# Patient Record
Sex: Male | Born: 1945 | Race: White | Hispanic: No | Marital: Married | Smoking: Former smoker
Health system: Southern US, Community
[De-identification: ages and names within clinical notes are randomized; demographics above are authoritative.]

## PROBLEM LIST (undated history)

## (undated) DIAGNOSIS — Z860100 Personal history of colon polyps, unspecified: Secondary | ICD-10-CM

## (undated) DIAGNOSIS — C439 Malignant melanoma of skin, unspecified: Secondary | ICD-10-CM

## (undated) DIAGNOSIS — L570 Actinic keratosis: Secondary | ICD-10-CM

## (undated) DIAGNOSIS — E785 Hyperlipidemia, unspecified: Secondary | ICD-10-CM

## (undated) DIAGNOSIS — T7840XA Allergy, unspecified, initial encounter: Secondary | ICD-10-CM

## (undated) DIAGNOSIS — M1990 Unspecified osteoarthritis, unspecified site: Secondary | ICD-10-CM

## (undated) DIAGNOSIS — J302 Other seasonal allergic rhinitis: Secondary | ICD-10-CM

## (undated) DIAGNOSIS — I10 Essential (primary) hypertension: Secondary | ICD-10-CM

## (undated) DIAGNOSIS — I499 Cardiac arrhythmia, unspecified: Secondary | ICD-10-CM

## (undated) HISTORY — PX: COLONOSCOPY: SHX174

## (undated) HISTORY — DX: Unspecified osteoarthritis, unspecified site: M19.90

## (undated) HISTORY — DX: Allergy, unspecified, initial encounter: T78.40XA

## (undated) HISTORY — DX: Personal history of colon polyps, unspecified: Z86.0100

## (undated) HISTORY — PX: TOTAL KNEE ARTHROPLASTY: SHX125

## (undated) HISTORY — DX: Malignant melanoma of skin, unspecified: C43.9

## (undated) HISTORY — DX: Personal history of colonic polyps: Z86.010

## (undated) HISTORY — DX: Actinic keratosis: L57.0

## (undated) HISTORY — DX: Hyperlipidemia, unspecified: E78.5

## (undated) HISTORY — DX: Other seasonal allergic rhinitis: J30.2

## (undated) HISTORY — PX: POLYPECTOMY: SHX149

## (undated) HISTORY — DX: Cardiac arrhythmia, unspecified: I49.9

## (undated) HISTORY — DX: Essential (primary) hypertension: I10

---

## 2005-02-03 ENCOUNTER — Ambulatory Visit: Payer: Self-pay | Admitting: Internal Medicine

## 2005-07-12 ENCOUNTER — Ambulatory Visit: Payer: Self-pay | Admitting: Internal Medicine

## 2005-07-24 ENCOUNTER — Ambulatory Visit: Payer: Self-pay | Admitting: Internal Medicine

## 2006-01-05 ENCOUNTER — Ambulatory Visit: Payer: Self-pay | Admitting: Dermatology

## 2006-05-24 ENCOUNTER — Ambulatory Visit: Payer: Self-pay | Admitting: Dermatology

## 2007-10-25 DIAGNOSIS — C439 Malignant melanoma of skin, unspecified: Secondary | ICD-10-CM

## 2007-10-25 HISTORY — DX: Malignant melanoma of skin, unspecified: C43.9

## 2007-10-25 HISTORY — PX: MELANOMA EXCISION: SHX5266

## 2008-07-18 ENCOUNTER — Ambulatory Visit: Payer: Self-pay | Admitting: Internal Medicine

## 2008-08-01 ENCOUNTER — Ambulatory Visit: Payer: Self-pay | Admitting: Internal Medicine

## 2009-10-24 HISTORY — PX: TOTAL KNEE ARTHROPLASTY: SHX125

## 2013-05-23 ENCOUNTER — Encounter: Payer: Self-pay | Admitting: Internal Medicine

## 2013-06-12 ENCOUNTER — Encounter: Payer: Self-pay | Admitting: Internal Medicine

## 2013-06-19 ENCOUNTER — Ambulatory Visit (AMBULATORY_SURGERY_CENTER): Payer: BC Managed Care – PPO | Admitting: *Deleted

## 2013-06-19 VITALS — Ht 72.0 in | Wt 197.0 lb

## 2013-06-19 DIAGNOSIS — Z1211 Encounter for screening for malignant neoplasm of colon: Secondary | ICD-10-CM

## 2013-06-19 MED ORDER — MOVIPREP 100 G PO SOLR: 1.0000 | Freq: Once | ORAL | Status: DC

## 2013-06-19 NOTE — Progress Notes (Signed)
Denies allergies to eggs or soy products. Denies complications with sedation or anesthesia. 

## 2013-06-20 ENCOUNTER — Encounter: Payer: Self-pay | Admitting: Internal Medicine

## 2013-07-02 ENCOUNTER — Encounter: Payer: Self-pay | Admitting: Internal Medicine

## 2013-07-02 ENCOUNTER — Ambulatory Visit (AMBULATORY_SURGERY_CENTER): Payer: Medicare Other | Admitting: Internal Medicine

## 2013-07-02 VITALS — BP 110/67 | HR 64 | Temp 98.0°F | Resp 14 | Ht 72.0 in | Wt 197.0 lb

## 2013-07-02 DIAGNOSIS — Z800 Family history of malignant neoplasm of digestive organs: Secondary | ICD-10-CM

## 2013-07-02 DIAGNOSIS — Z86010 Personal history of colonic polyps: Secondary | ICD-10-CM

## 2013-07-02 DIAGNOSIS — Z1211 Encounter for screening for malignant neoplasm of colon: Secondary | ICD-10-CM

## 2013-07-02 DIAGNOSIS — D126 Benign neoplasm of colon, unspecified: Secondary | ICD-10-CM

## 2013-07-02 MED ORDER — SODIUM CHLORIDE 0.9 % IV SOLN: 500.0000 mL | INTRAVENOUS | Status: DC

## 2013-07-02 NOTE — Op Note (Signed)
[facility] [address] [place], [address]   COLONOSCOPY PROCEDURE REPORT  PATIENT: [name], [name]  MR#: [id number] BIRTHDATE: 1946-05-29 , 66  yrs. old GENDER: Male ENDOSCOPIST: Hart Carwin, MD REFERRED NF:AOZHY Masoud, M.D. PROCEDURE DATE:  07/02/2013 PROCEDURE:   Colonoscopy with snare polypectomy First Screening Colonoscopy - Avg.  risk and is 50 yrs.  old or older - No.  Prior Negative Screening - Now for repeat screening. N/A  History of Adenoma - Now for follow-up colonoscopy & has been > or = to 3 yrs.  Yes hx of adenoma.  Has been 3 or more years since last colonoscopy.  Polyps Removed Today? Yes. ASA CLASS:   Class II INDICATIONS:colonoscopy 2004- hyperplastic polyp, 2009- no polyps, LLQ abdominal pain. MEDICATIONS: propofol (Diprivan) 300mg  IV  DESCRIPTION OF PROCEDURE:   After the risks benefits and alternatives of the procedure were thoroughly explained, informed consent was obtained.  A digital rectal exam revealed no abnormalities of the rectum.   The LB [id number]  endoscope was introduced through the anus and advanced to the cecum, which was identified by both the appendix and ileocecal valve. No adverse events experienced.   The quality of the prep was good, using MoviPrep  The instrument was then slowly withdrawn as the colon was fully examined.      COLON FINDINGS: A polypoid shaped pedunculated polyp ranging between 3-16mm in size was found at the ileocecal valve.  A polypectomy was performed with a cold snare.  The resection was complete and the polyp tissue was completely retrieved.there was small amount of persistent bleeing from polypectomy site- Endo clips  2 were placed to achieve hemostasis   Small internal hemorrhoids were found. There was no abnormality to explain LLQ abd. pain Retroflexed views revealed no abnormalities. The time to cecum=3 minutes 10 seconds. Withdrawal time=13 minutes 0 seconds. There were first  grade internal hemorrhoids The scope was withdrawn and the procedure completed. COMPLICATIONS: There were no complications.  ENDOSCOPIC IMPRESSION: 1.   Pedunculated polyp ranging between 3-41mm in size was found at the ileocecal valve; polypectomy was performed with a cold snare , 2 pieces obtained 2.   Small internal hemorrhoids 3.  no significant diverticulosis  RECOMMENDATIONS: 1.  High fiber diet 2.   Metamucil 1 tsp daily 3. OV if abdominal pain persists   eSigned:  Hart Carwin, MD 07/02/2013 2:17 PM   cc:   PATIENT NAME:  [name], [name] MR#: [id number]

## 2013-07-02 NOTE — Progress Notes (Signed)
Called to room to assist during endoscopic procedure.  Patient ID and intended procedure confirmed with present staff. Received instructions for my participation in the procedure from the performing physician.  

## 2013-07-02 NOTE — Patient Instructions (Addendum)

## 2013-07-02 NOTE — Progress Notes (Signed)
Lidocaine-40mg IV prior to Propofol InductionPropofol given over incremental dosages 

## 2013-07-02 NOTE — Progress Notes (Signed)
Patient did not have preoperative order for IV antibiotic SSI prophylaxis. (G8918)  Patient did not experience any of the following events: a burn prior to discharge; a fall within the facility; wrong site/side/patient/procedure/implant event; or a hospital transfer or hospital admission upon discharge from the facility. (G8907)  

## 2013-07-03 ENCOUNTER — Telehealth: Payer: Self-pay | Admitting: *Deleted

## 2013-07-03 NOTE — Telephone Encounter (Signed)
  Follow up Call-  Call back number 07/02/2013  Post procedure Call Back phone  # [phone]  Permission to leave phone message Yes     Patient questions:  Do you have a fever, pain , or abdominal swelling? no Pain Score  0 *  Have you tolerated food without any problems? yes  Have you been able to return to your normal activities? yes  Do you have any questions about your discharge instructions: Diet   no Medications  no Follow up visit  no  Do you have questions or concerns about your Care? no  Actions: * If pain score is 4 or above: No action needed, pain <4.

## 2013-07-08 ENCOUNTER — Encounter: Payer: Self-pay | Admitting: Internal Medicine

## 2013-07-10 ENCOUNTER — Telehealth: Payer: Self-pay | Admitting: Internal Medicine

## 2013-07-10 NOTE — Telephone Encounter (Signed)
Spoke with patient and gave him results as per letter on 07/08/13.

## 2013-07-17 ENCOUNTER — Ambulatory Visit: Payer: Self-pay | Admitting: Internal Medicine

## 2013-07-31 ENCOUNTER — Encounter: Payer: Self-pay | Admitting: Internal Medicine

## 2013-12-04 ENCOUNTER — Ambulatory Visit: Payer: Self-pay | Admitting: General Practice

## 2013-12-04 LAB — CBC
HCT: 44.6 % (ref 40.0–52.0)
HGB: 14.8 g/dL (ref 13.0–18.0)
MCH: 31.1 pg (ref 26.0–34.0)
MCHC: 33.1 g/dL (ref 32.0–36.0)
MCV: 94 fL (ref 80–100)
PLATELETS: 202 10*3/uL (ref 150–440)
RBC: 4.75 10*6/uL (ref 4.40–5.90)
RDW: 13.7 % (ref 11.5–14.5)
WBC: 5.5 10*3/uL (ref 3.8–10.6)

## 2013-12-04 LAB — URINALYSIS, COMPLETE
BACTERIA: NONE SEEN
Bilirubin,UR: NEGATIVE
Blood: NEGATIVE
Glucose,UR: NEGATIVE mg/dL (ref 0–75)
KETONE: NEGATIVE
Leukocyte Esterase: NEGATIVE
Nitrite: NEGATIVE
Ph: 5 (ref 4.5–8.0)
Protein: NEGATIVE
Specific Gravity: 1.015 (ref 1.003–1.030)
Squamous Epithelial: <1
WBC UR: 2 /HPF (ref 0–5)

## 2013-12-04 LAB — BASIC METABOLIC PANEL
Anion Gap: 6 — ABNORMAL LOW (ref 7–16)
BUN: 11 mg/dL (ref 7–18)
CALCIUM: 9.3 mg/dL (ref 8.5–10.1)
CHLORIDE: 103 mmol/L (ref 98–107)
CREATININE: 0.88 mg/dL (ref 0.60–1.30)
Co2: 29 mmol/L (ref 21–32)
EGFR (African American): >60
GLUCOSE: 101 mg/dL — AB (ref 65–99)
OSMOLALITY: 275 (ref 275–301)
POTASSIUM: 4.2 mmol/L (ref 3.5–5.1)
SODIUM: 138 mmol/L (ref 136–145)

## 2013-12-04 LAB — MRSA PCR SCREENING

## 2013-12-04 LAB — SEDIMENTATION RATE: ERYTHROCYTE SED RATE: 14 mm/h (ref 0–20)

## 2013-12-04 LAB — PROTIME-INR
INR: 0.9
Prothrombin Time: 12.4 secs (ref 11.5–14.7)

## 2013-12-04 LAB — APTT: ACTIVATED PTT: 31 s (ref 23.6–35.9)

## 2013-12-05 LAB — URINE CULTURE

## 2013-12-09 ENCOUNTER — Inpatient Hospital Stay: Payer: Self-pay | Admitting: General Practice

## 2013-12-10 LAB — PLATELET COUNT: Platelet: 168 10*3/uL (ref 150–440)

## 2013-12-10 LAB — BASIC METABOLIC PANEL
Anion Gap: 4 — ABNORMAL LOW (ref 7–16)
BUN: 8 mg/dL (ref 7–18)
Calcium, Total: 8.3 mg/dL — ABNORMAL LOW (ref 8.5–10.1)
Chloride: 105 mmol/L (ref 98–107)
Co2: 27 mmol/L (ref 21–32)
Creatinine: 0.86 mg/dL (ref 0.60–1.30)
EGFR (African American): >60
EGFR (Non-African Amer.): >60
Glucose: 102 mg/dL — ABNORMAL HIGH (ref 65–99)
OSMOLALITY: 270 (ref 275–301)
POTASSIUM: 3.6 mmol/L (ref 3.5–5.1)
Sodium: 136 mmol/L (ref 136–145)

## 2013-12-10 LAB — HEMOGLOBIN: HGB: 12.3 g/dL — ABNORMAL LOW (ref 13.0–18.0)

## 2013-12-11 LAB — BASIC METABOLIC PANEL
Anion Gap: 4 — ABNORMAL LOW (ref 7–16)
BUN: 8 mg/dL (ref 7–18)
CALCIUM: 7.4 mg/dL — AB (ref 8.5–10.1)
Chloride: 105 mmol/L (ref 98–107)
Co2: 28 mmol/L (ref 21–32)
Creatinine: 0.85 mg/dL (ref 0.60–1.30)
EGFR (African American): >60
EGFR (Non-African Amer.): >60
Glucose: 103 mg/dL — ABNORMAL HIGH (ref 65–99)
OSMOLALITY: 272 (ref 275–301)
POTASSIUM: 3.8 mmol/L (ref 3.5–5.1)
SODIUM: 137 mmol/L (ref 136–145)

## 2013-12-11 LAB — HEMOGLOBIN: HGB: 11.9 g/dL — ABNORMAL LOW (ref 13.0–18.0)

## 2013-12-11 LAB — PLATELET COUNT: PLATELETS: 173 10*3/uL (ref 150–440)

## 2015-02-14 NOTE — Discharge Summary (Signed)
PATIENT NAMEJOSSUE, [name] MR#:  [id number] DATE OF BIRTH:  Oct 16, 1946  DATE OF ADMISSION:  12/09/2013 DATE OF DISCHARGE:  12/11/2013  ADMITTING DIAGNOSIS: Degenerative arthritis of left knee.   DISCHARGE DIAGNOSIS: Degenerative arthritis of left knee.   PROCEDURE PERFORMED: Left total knee arthroplasty using computer-assisted navigation.   SURGEON: Juanito Doom., M.D.   ASSISTANT: Marylee Floras, PA.   ANESTHESIA: Spinal.   ESTIMATED BLOOD LOSS: 100 mL.   FLUIDS REPLACED: 200 mL of crystalloid.   TOURNIQUET TIME: 110 minutes.  DRAINS: Two medium drains to reinfusion system.   SOFT TISSUE RELEASES: Anterior cruciate ligament, posterior cruciate ligament, deep and superficial medial collateral ligament and patellofemoral ligament.   IMPLANTS UTILIZED: DePuy PFC Sigma size 4 posterior stabilized femoral component, size 5 MBT tibial component, 35 mm three peg oval dome patella, and a 10 mm stabilized rotating platform polyethylene insert.   HISTORY: The patient is a 69 year old who presents today for history and physical. He is to undergo left total knee arthroplasty on 12/09/2013. He has had greater than one year history of progressive left knee pain. He localizes most of the pain along the medial aspect of the knee. Pain is aggravated by weight-bearing activities. He is not currently using any ambulatory aids. He denies any gross swelling of the knee. He has had some episodes of near giving way of the left knee. The patient has undergone a right total knee arthroplasty performed by Dr. Claiborne Billings at [facility] in 2011. He still having a little bit of problem with the right knee.   PHYSICAL EXAMINATION: GENERAL: Well developed, well-nourished male in no apparent distress. Normal gait.  HEENT: Head normocephalic, atraumatic. Pupils equal, round, and reactive to light. Extraocular motion is intact. Sclerae are clear.  OROPHARYNX: Clear with moist mucosa.  NECK: Supple, nontender with good range  of motion. No thyromegaly, adenopathy, JVD or carotid bruits.  LUNGS: Clear to auscultation bilaterally.  CARDIOVASCULAR: Regular rate and rhythm. Normal S1 and S2. No appreciable murmurs, gallops, or rubs. Peripheral pulses are palpable. No pretibial ankle edema. Homans test is negative.  ABDOMEN: Soft, nontender, nondistended. Bowel sounds are present.  LEFT KNEE: Has no swelling, warmth or erythema or effusion. He has mild crepitus on range of motion. He is tender along the medial joint line and he has a relative varus alignment. He has a medial pseudolaxity present. There is a negative anterior Lachman's as well as McMurray's test performed. He has no significant quadriceps atrophy. He has 0 to 122 degrees range of motion. He is neurovascularly intact in the left lower extremity.   HOSPITAL COURSE: The patient was admitted to the hospital on 12/09/2013. He had surgery that same, was brought to the orthopedic floor from the PAC-U in stable condition. On postoperative day one, the patient was doing very well. His pain was controlled and hemoglobin dropped from 14.8 to 12.3, platelets were stable at 168. The patient's vital signs are stable. He had excellent progress with physical therapy. On postoperative day two, the patient's hemoglobin remained stable. He did have a slight decrease in hemoglobin to 11.9. Platelets were up to 173. He continued to excel with physical therapy. He was able to meet all physical therapy goals. The patient was tolerating p.o. well. He was able to have a bowel movement and was ready for discharge to home with home health physical therapy.   CONDITION AT DISCHARGE: Stable.   DISCHARGE INSTRUCTIONS: The patient may increase weight-bearing on the affected extremity, elevate the  affected foot on 1 or 2 pillows with the foot higher than the knee. Thigh-high TED hose on both legs and remove at bedtime, replace on arising the next morning. Elevate the heels off the bed. Use  incentive spirometry every hour while awake and encourage cough and deep breathing. He may resume a regular diet as tolerated. Continue using Polar Care unit maintaining temperature between 40 and 50 degrees. Do not get the dressing bandage wet or dirty. Call [facility] if the dressing gets water under it. Leave the dressing on. Call [facility] if any of the following occur: Bright red bleeding from the incision wound, fever above 101.5 degrees, redness, swelling, or drainage at the incision. Call [facility] if you experience any increased leg pain, numbness or weakness in your legs or bowel or bladder symptoms. Home physical therapy has been arranged for continuation of rehab program. Please call [facility] if a therapist has not contacted you within 48 hours of your return home. Home occupational therapy has been arranged for continuation of rehab program. Please call [facility] if a therapist has not contacted you within 48 hours of your return home.   FOLLOW-UP: The patient's follow-up appointment 12/24/2013 at 2:45 p.m. with Marylee Floras.   DISCHARGE MEDICATIONS: Please look at discharge instructions from 12/10/2013 to get  discharge medications.   ____________________________ T. Rachelle Hora, PA-C tcg:sg D: 12/11/2013 09:55:11 ET T: 12/11/2013 10:46:50 ET JOB#: [id number]  cc: T. Rachelle Hora, PA-C, <Dictator> Duanne Guess [place] ELECTRONICALLY SIGNED 12/17/2013 15:53

## 2015-02-14 NOTE — Op Note (Signed)
PATIENT NAMEKHARI, [name] MR#:  [id number] DATE OF BIRTH:  1945/12/11  DATE OF PROCEDURE:  12/09/2013  PREOPERATIVE DIAGNOSIS: Degenerative arthrosis of the left knee.   POSTOPERATIVE DIAGNOSIS: Degenerative arthrosis of the left knee.   PROCEDURE PERFORMED: Left total knee arthroplasty using computer-assisted navigation.   SURGEON: Laurice Record. Holley Bouche., MD  ASSISTANT: Vance Peper, PA (required to maintain retraction throughout the procedure).   ANESTHESIA: Spinal.   ESTIMATED BLOOD LOSS: 100 mL.   FLUIDS REPLACED: 2200 mL of crystalloid.   TOURNIQUET TIME: 110 minutes.   DRAINS: Two medium drains to reinfusion system.   SOFT TISSUE RELEASES: Anterior cruciate ligament, posterior cruciate ligament, deep and superficial medial collateral ligament and patellofemoral ligament.   IMPLANTS UTILIZED: DePuy PFC Sigma size 4 posterior stabilized femoral component (cemented), size 5 MBT tibial component (cemented), 35 mm 3 peg oval dome patella (cemented) and a 10 mm stabilized rotating platform polyethylene insert.   INDICATIONS FOR SURGERY: The patient is a 69 year old gentleman who has been seen for complaints of progressive left knee pain. X-rays demonstrated significant degenerative changes in a tricompartmental fashion with relative varus deformity. After discussion of the risks and benefits of surgical intervention, the patient expressed understanding of the risks and benefits and agreed with plans for surgical intervention.   PROCEDURE IN DETAIL: The patient was brought into the operating room and, after adequate spinal anesthesia was achieved, a tourniquet was placed on the patient's upper left thigh. The patient's left knee and leg were cleaned and prepped with alcohol and DuraPrep and draped in the usual sterile fashion. A "timeout" was performed as per usual protocol. The left lower extremity was exsanguinated using an Esmarch, and tourniquet was inflated to 300 mmHg. An anterior  longitudinal incision was made, followed by a standard mid vastus approach. A moderate effusion was evacuated. The deep fibers of the medial collateral ligament were elevated in a subperiosteal fashion off the medial flare of the tibia so as to maintain a continuous soft tissue sleeve. The patella was subluxed laterally, and the patellofemoral ligament was incised. Inspection of the knee demonstrated degenerative changes in a tricompartmental fashion with full-thickness loss of articular cartilage to the medial compartment. Prominent osteophytes were debrided using a rongeur. The anterior and posterior cruciate ligaments were excised. Two 4.0 mm Schanz pins were inserted into the femur and into the tibia for attachment of the array of trackers used for computer assisted navigation. Hip center was identified using the circumduction technique. Distal landmarks were mapped using the computer. The distal femur and proximal tibia were mapped using the computer. A distal femoral cutting guide was positioned using computer-assisted navigation so as to achieve a 5 degree distal valgus cut. The cut was performed and verified using the computer. Distal femur was sized, and it was felt that a size 4 femoral component was appropriate. A size 4 cutting guide was positioned, and anterior cut was performed and verified using the computer. This was followed by completion of the posterior and chamfer cuts. Femoral cutting guide for a central box was then positioned and the central box cut was performed. Attention was then directed to the proximal tibia. Medial and lateral menisci were excised. The extramedullary tibial cutting guide was positioned using computer-assisted navigation so as to achieve 0 degree varus and valgus alignment and 0 degree posterior slope. Cut was performed and verified using the computer. The proximal tibia was sized, and it was felt that a size 5 tibial tray was appropriate. Tibial  and femoral trials were  inserted, followed by insertion of a 10 mm polyethylene trial. The knee was felt to be tight medially. A Cobb elevator was used to elevate the superficial fibers of the medial collateral ligament. This allowed for good medial and lateral soft tissue balancing both in full extension and in flexion. Finally, the patella was assessed. A bipartite patella was encountered. The smaller portion of the bipartite portion was carefully excised as it was noted to be grossly loose and held with only cartilaginous material to the main body of the patella. The patella was then cut and prepared so as to accommodate a 35 mm 3 peg oval dome patella. Patellar trial was placed, and the knee was placed through a range of motion, with excellent patellar tracking appreciated. The femoral trial was removed. Central post hole for the tibial component was reamed, followed by insertion of a keel punch. Tibial trial was then removed. Cut surfaces of bone were irrigated with copious amounts of normal saline with antibiotic solution using pulsatile lavage and then suctioned dry. Polymethyl methacrylate cement was prepared in the usual fashion using a vacuum mixer. Cement was applied to the cut surface of the proximal tibia as well as along the undersurface of a size 5 MBT tibial component. Tibial component was positioned and impacted into place. Excess cement was removed using [employer] fast streamer. Cement was applied to the cut surface of the femur as well as along the posterior flanges of a size 4 posterior stabilized femoral component. Femoral component was positioned and impacted into place. Excess cement was removed using [employer] fast streamer. A 10 mm polyethylene trial was inserted, and the knee was brought into full extension with steady axial compression applied. Finally, cement was applied to the backside of a 35 mm 3 peg oval dome patella, and the patellar component was positioned and patellar clamp applied. Excess cement was removed using Air cabin crew. After adequate curing of cement, the tourniquet was deflated after a total tourniquet time of 110 minutes. Hemostasis was achieved using electrocautery. The knee was irrigated with copious amounts of normal saline with antibiotic solution using pulsatile lavage and then suctioned dry. The knee was inspected for any residual cement debris. Then, 20 mL of 1.3% Exparel in 40 mL of normal saline was injected along the posterior capsule, medial and lateral gutters and along the arthrotomy site. A 10 mm stabilized rotating platform polyethylene insert was inserted, and the knee was placed through range of motion, with excellent medial and lateral soft tissue balancing appreciated and excellent patellar tracking appreciated. Two medium drains were placed in the wound bed and brought out through a separate stab incision to be attached to a reinfusion system. The medial parapatellar portion of the incision was reapproximated using interrupted sutures of #1 Vicryl. The subcutaneous tissue was approximated in layers using first #0 Vicryl followed by 2-0 Vicryl. Skin was closed with skin staples. Then, 30 mL of 0.25% Marcaine with epinephrine was injected in the subcutaneous tissue along the incision site. A sterile dressing was applied.   The patient tolerated the procedure well. He was transported to the recovery room in stable condition.    ____________________________ Laurice Record. Holley Bouche., MD jph:lb D: 12/10/2013 07:21:07 ET T: 12/10/2013 07:34:17 ET JOB#: [id number]  cc: Laurice Record. Holley Bouche., MD, <Dictator> Laurice Record Holley Bouche MD ELECTRONICALLY SIGNED 12/13/2013 6:44

## 2015-03-18 ENCOUNTER — Encounter: Payer: Self-pay | Admitting: Internal Medicine

## 2015-11-03 DIAGNOSIS — Z1283 Encounter for screening for malignant neoplasm of skin: Secondary | ICD-10-CM | POA: Diagnosis not present

## 2015-11-03 DIAGNOSIS — L814 Other melanin hyperpigmentation: Secondary | ICD-10-CM | POA: Diagnosis not present

## 2015-11-03 DIAGNOSIS — L570 Actinic keratosis: Secondary | ICD-10-CM | POA: Diagnosis not present

## 2015-11-03 DIAGNOSIS — D225 Melanocytic nevi of trunk: Secondary | ICD-10-CM | POA: Diagnosis not present

## 2015-11-03 DIAGNOSIS — Z85828 Personal history of other malignant neoplasm of skin: Secondary | ICD-10-CM | POA: Diagnosis not present

## 2015-11-03 DIAGNOSIS — L578 Other skin changes due to chronic exposure to nonionizing radiation: Secondary | ICD-10-CM | POA: Diagnosis not present

## 2015-11-03 DIAGNOSIS — D1800 Hemangioma unspecified site: Secondary | ICD-10-CM | POA: Diagnosis not present

## 2015-11-03 DIAGNOSIS — L821 Other seborrheic keratosis: Secondary | ICD-10-CM | POA: Diagnosis not present

## 2015-11-03 DIAGNOSIS — D229 Melanocytic nevi, unspecified: Secondary | ICD-10-CM | POA: Diagnosis not present

## 2015-11-03 DIAGNOSIS — L820 Inflamed seborrheic keratosis: Secondary | ICD-10-CM | POA: Diagnosis not present

## 2015-11-03 DIAGNOSIS — Z85820 Personal history of malignant melanoma of skin: Secondary | ICD-10-CM | POA: Diagnosis not present

## 2015-11-03 DIAGNOSIS — D485 Neoplasm of uncertain behavior of skin: Secondary | ICD-10-CM | POA: Diagnosis not present

## 2015-11-30 DIAGNOSIS — L570 Actinic keratosis: Secondary | ICD-10-CM | POA: Diagnosis not present

## 2016-01-05 DIAGNOSIS — L570 Actinic keratosis: Secondary | ICD-10-CM | POA: Diagnosis not present

## 2016-03-04 DIAGNOSIS — D1800 Hemangioma unspecified site: Secondary | ICD-10-CM | POA: Diagnosis not present

## 2016-03-04 DIAGNOSIS — L814 Other melanin hyperpigmentation: Secondary | ICD-10-CM | POA: Diagnosis not present

## 2016-03-04 DIAGNOSIS — L820 Inflamed seborrheic keratosis: Secondary | ICD-10-CM | POA: Diagnosis not present

## 2016-03-04 DIAGNOSIS — Z1283 Encounter for screening for malignant neoplasm of skin: Secondary | ICD-10-CM | POA: Diagnosis not present

## 2016-03-04 DIAGNOSIS — L821 Other seborrheic keratosis: Secondary | ICD-10-CM | POA: Diagnosis not present

## 2016-03-04 DIAGNOSIS — Z85828 Personal history of other malignant neoplasm of skin: Secondary | ICD-10-CM | POA: Diagnosis not present

## 2016-03-04 DIAGNOSIS — T07 Unspecified multiple injuries: Secondary | ICD-10-CM | POA: Diagnosis not present

## 2016-03-04 DIAGNOSIS — Z85820 Personal history of malignant melanoma of skin: Secondary | ICD-10-CM | POA: Diagnosis not present

## 2016-03-04 DIAGNOSIS — L570 Actinic keratosis: Secondary | ICD-10-CM | POA: Diagnosis not present

## 2016-03-31 DIAGNOSIS — I10 Essential (primary) hypertension: Secondary | ICD-10-CM | POA: Diagnosis not present

## 2016-04-07 DIAGNOSIS — N401 Enlarged prostate with lower urinary tract symptoms: Secondary | ICD-10-CM | POA: Diagnosis not present

## 2016-04-07 DIAGNOSIS — R351 Nocturia: Secondary | ICD-10-CM | POA: Diagnosis not present

## 2016-04-07 DIAGNOSIS — C4370 Malignant melanoma of unspecified lower limb, including hip: Secondary | ICD-10-CM | POA: Diagnosis not present

## 2016-04-07 DIAGNOSIS — I10 Essential (primary) hypertension: Secondary | ICD-10-CM | POA: Diagnosis not present

## 2016-08-24 DIAGNOSIS — L812 Freckles: Secondary | ICD-10-CM | POA: Diagnosis not present

## 2016-08-24 DIAGNOSIS — L570 Actinic keratosis: Secondary | ICD-10-CM | POA: Diagnosis not present

## 2016-08-24 DIAGNOSIS — L821 Other seborrheic keratosis: Secondary | ICD-10-CM | POA: Diagnosis not present

## 2016-08-24 DIAGNOSIS — Z1283 Encounter for screening for malignant neoplasm of skin: Secondary | ICD-10-CM | POA: Diagnosis not present

## 2016-08-24 DIAGNOSIS — L578 Other skin changes due to chronic exposure to nonionizing radiation: Secondary | ICD-10-CM | POA: Diagnosis not present

## 2016-08-24 DIAGNOSIS — Z85820 Personal history of malignant melanoma of skin: Secondary | ICD-10-CM | POA: Diagnosis not present

## 2016-08-24 DIAGNOSIS — L820 Inflamed seborrheic keratosis: Secondary | ICD-10-CM | POA: Diagnosis not present

## 2016-08-24 DIAGNOSIS — D1800 Hemangioma unspecified site: Secondary | ICD-10-CM | POA: Diagnosis not present

## 2016-08-24 DIAGNOSIS — I8310 Varicose veins of unspecified lower extremity with inflammation: Secondary | ICD-10-CM | POA: Diagnosis not present

## 2016-08-24 DIAGNOSIS — Z85828 Personal history of other malignant neoplasm of skin: Secondary | ICD-10-CM | POA: Diagnosis not present

## 2016-09-21 DIAGNOSIS — J209 Acute bronchitis, unspecified: Secondary | ICD-10-CM | POA: Diagnosis not present

## 2016-09-21 DIAGNOSIS — R233 Spontaneous ecchymoses: Secondary | ICD-10-CM | POA: Diagnosis not present

## 2016-09-21 DIAGNOSIS — R05 Cough: Secondary | ICD-10-CM | POA: Diagnosis not present

## 2016-09-21 DIAGNOSIS — N401 Enlarged prostate with lower urinary tract symptoms: Secondary | ICD-10-CM | POA: Diagnosis not present

## 2016-09-21 DIAGNOSIS — I10 Essential (primary) hypertension: Secondary | ICD-10-CM | POA: Diagnosis not present

## 2016-09-21 DIAGNOSIS — C4359 Malignant melanoma of other part of trunk: Secondary | ICD-10-CM | POA: Diagnosis not present

## 2016-09-28 DIAGNOSIS — Z0000 Encounter for general adult medical examination without abnormal findings: Secondary | ICD-10-CM | POA: Diagnosis not present

## 2017-03-01 DIAGNOSIS — D229 Melanocytic nevi, unspecified: Secondary | ICD-10-CM | POA: Diagnosis not present

## 2017-03-01 DIAGNOSIS — D1800 Hemangioma unspecified site: Secondary | ICD-10-CM | POA: Diagnosis not present

## 2017-03-01 DIAGNOSIS — L821 Other seborrheic keratosis: Secondary | ICD-10-CM | POA: Diagnosis not present

## 2017-03-01 DIAGNOSIS — D692 Other nonthrombocytopenic purpura: Secondary | ICD-10-CM | POA: Diagnosis not present

## 2017-03-01 DIAGNOSIS — L739 Follicular disorder, unspecified: Secondary | ICD-10-CM | POA: Diagnosis not present

## 2017-03-01 DIAGNOSIS — L570 Actinic keratosis: Secondary | ICD-10-CM | POA: Diagnosis not present

## 2017-03-01 DIAGNOSIS — Z1283 Encounter for screening for malignant neoplasm of skin: Secondary | ICD-10-CM | POA: Diagnosis not present

## 2017-03-01 DIAGNOSIS — L820 Inflamed seborrheic keratosis: Secondary | ICD-10-CM | POA: Diagnosis not present

## 2017-03-01 DIAGNOSIS — Z85828 Personal history of other malignant neoplasm of skin: Secondary | ICD-10-CM | POA: Diagnosis not present

## 2017-03-01 DIAGNOSIS — Z85820 Personal history of malignant melanoma of skin: Secondary | ICD-10-CM | POA: Diagnosis not present

## 2017-07-03 DIAGNOSIS — Z85828 Personal history of other malignant neoplasm of skin: Secondary | ICD-10-CM | POA: Diagnosis not present

## 2017-07-03 DIAGNOSIS — L570 Actinic keratosis: Secondary | ICD-10-CM | POA: Diagnosis not present

## 2017-07-03 DIAGNOSIS — L821 Other seborrheic keratosis: Secondary | ICD-10-CM | POA: Diagnosis not present

## 2017-07-03 DIAGNOSIS — Z85820 Personal history of malignant melanoma of skin: Secondary | ICD-10-CM | POA: Diagnosis not present

## 2017-07-03 DIAGNOSIS — L578 Other skin changes due to chronic exposure to nonionizing radiation: Secondary | ICD-10-CM | POA: Diagnosis not present

## 2017-07-03 DIAGNOSIS — L812 Freckles: Secondary | ICD-10-CM | POA: Diagnosis not present

## 2017-07-03 DIAGNOSIS — D229 Melanocytic nevi, unspecified: Secondary | ICD-10-CM | POA: Diagnosis not present

## 2017-07-03 DIAGNOSIS — L820 Inflamed seborrheic keratosis: Secondary | ICD-10-CM | POA: Diagnosis not present

## 2017-07-03 DIAGNOSIS — D1800 Hemangioma unspecified site: Secondary | ICD-10-CM | POA: Diagnosis not present

## 2017-10-03 DIAGNOSIS — R5381 Other malaise: Secondary | ICD-10-CM | POA: Diagnosis not present

## 2017-10-03 DIAGNOSIS — I10 Essential (primary) hypertension: Secondary | ICD-10-CM | POA: Diagnosis not present

## 2017-10-03 DIAGNOSIS — E7849 Other hyperlipidemia: Secondary | ICD-10-CM | POA: Diagnosis not present

## 2017-10-04 DIAGNOSIS — L821 Other seborrheic keratosis: Secondary | ICD-10-CM | POA: Diagnosis not present

## 2017-10-04 DIAGNOSIS — L820 Inflamed seborrheic keratosis: Secondary | ICD-10-CM | POA: Diagnosis not present

## 2017-10-04 DIAGNOSIS — L570 Actinic keratosis: Secondary | ICD-10-CM | POA: Diagnosis not present

## 2017-10-04 DIAGNOSIS — L578 Other skin changes due to chronic exposure to nonionizing radiation: Secondary | ICD-10-CM | POA: Diagnosis not present

## 2017-10-11 DIAGNOSIS — J209 Acute bronchitis, unspecified: Secondary | ICD-10-CM | POA: Diagnosis not present

## 2017-10-11 DIAGNOSIS — C4370 Malignant melanoma of unspecified lower limb, including hip: Secondary | ICD-10-CM | POA: Diagnosis not present

## 2017-10-11 DIAGNOSIS — R05 Cough: Secondary | ICD-10-CM | POA: Diagnosis not present

## 2017-10-11 DIAGNOSIS — N401 Enlarged prostate with lower urinary tract symptoms: Secondary | ICD-10-CM | POA: Diagnosis not present

## 2017-10-24 DIAGNOSIS — C4492 Squamous cell carcinoma of skin, unspecified: Secondary | ICD-10-CM

## 2017-10-24 HISTORY — DX: Squamous cell carcinoma of skin, unspecified: C44.92

## 2017-11-02 DIAGNOSIS — C44329 Squamous cell carcinoma of skin of other parts of face: Secondary | ICD-10-CM | POA: Diagnosis not present

## 2017-11-02 DIAGNOSIS — L820 Inflamed seborrheic keratosis: Secondary | ICD-10-CM | POA: Diagnosis not present

## 2017-11-02 DIAGNOSIS — L578 Other skin changes due to chronic exposure to nonionizing radiation: Secondary | ICD-10-CM | POA: Diagnosis not present

## 2017-11-02 DIAGNOSIS — D485 Neoplasm of uncertain behavior of skin: Secondary | ICD-10-CM | POA: Diagnosis not present

## 2017-11-02 DIAGNOSIS — L821 Other seborrheic keratosis: Secondary | ICD-10-CM | POA: Diagnosis not present

## 2018-01-11 DIAGNOSIS — D2230 Melanocytic nevi of unspecified part of face: Secondary | ICD-10-CM | POA: Diagnosis not present

## 2018-01-11 DIAGNOSIS — D225 Melanocytic nevi of trunk: Secondary | ICD-10-CM | POA: Diagnosis not present

## 2018-01-11 DIAGNOSIS — L570 Actinic keratosis: Secondary | ICD-10-CM | POA: Diagnosis not present

## 2018-01-11 DIAGNOSIS — Z85820 Personal history of malignant melanoma of skin: Secondary | ICD-10-CM | POA: Diagnosis not present

## 2018-01-11 DIAGNOSIS — D1800 Hemangioma unspecified site: Secondary | ICD-10-CM | POA: Diagnosis not present

## 2018-01-11 DIAGNOSIS — L821 Other seborrheic keratosis: Secondary | ICD-10-CM | POA: Diagnosis not present

## 2018-01-11 DIAGNOSIS — Z1283 Encounter for screening for malignant neoplasm of skin: Secondary | ICD-10-CM | POA: Diagnosis not present

## 2018-01-11 DIAGNOSIS — L812 Freckles: Secondary | ICD-10-CM | POA: Diagnosis not present

## 2018-01-11 DIAGNOSIS — D229 Melanocytic nevi, unspecified: Secondary | ICD-10-CM | POA: Diagnosis not present

## 2018-01-11 DIAGNOSIS — L578 Other skin changes due to chronic exposure to nonionizing radiation: Secondary | ICD-10-CM | POA: Diagnosis not present

## 2018-01-11 DIAGNOSIS — L820 Inflamed seborrheic keratosis: Secondary | ICD-10-CM | POA: Diagnosis not present

## 2018-01-11 DIAGNOSIS — Z85828 Personal history of other malignant neoplasm of skin: Secondary | ICD-10-CM | POA: Diagnosis not present

## 2018-07-31 ENCOUNTER — Other Ambulatory Visit: Payer: Self-pay | Admitting: Internal Medicine

## 2018-07-31 DIAGNOSIS — R52 Pain, unspecified: Secondary | ICD-10-CM

## 2018-07-31 DIAGNOSIS — R102 Pelvic and perineal pain: Secondary | ICD-10-CM | POA: Diagnosis not present

## 2018-07-31 DIAGNOSIS — R350 Frequency of micturition: Secondary | ICD-10-CM | POA: Diagnosis not present

## 2018-07-31 DIAGNOSIS — R351 Nocturia: Secondary | ICD-10-CM | POA: Diagnosis not present

## 2018-07-31 DIAGNOSIS — K649 Unspecified hemorrhoids: Secondary | ICD-10-CM | POA: Diagnosis not present

## 2018-07-31 DIAGNOSIS — N401 Enlarged prostate with lower urinary tract symptoms: Secondary | ICD-10-CM | POA: Diagnosis not present

## 2018-07-31 DIAGNOSIS — Z125 Encounter for screening for malignant neoplasm of prostate: Secondary | ICD-10-CM | POA: Diagnosis not present

## 2018-08-01 ENCOUNTER — Ambulatory Visit
Admission: RE | Admit: 2018-08-01 | Discharge: 2018-08-01 | Disposition: A | Discharge status: Home / Self Care | Payer: PPO | Source: Ambulatory Visit | Attending: Internal Medicine | Admitting: Internal Medicine

## 2018-08-01 DIAGNOSIS — R52 Pain, unspecified: Secondary | ICD-10-CM

## 2018-08-01 DIAGNOSIS — M47816 Spondylosis without myelopathy or radiculopathy, lumbar region: Secondary | ICD-10-CM | POA: Insufficient documentation

## 2018-08-01 DIAGNOSIS — M545 Low back pain: Secondary | ICD-10-CM | POA: Diagnosis not present

## 2018-08-02 DIAGNOSIS — L821 Other seborrheic keratosis: Secondary | ICD-10-CM | POA: Diagnosis not present

## 2018-08-02 DIAGNOSIS — L720 Epidermal cyst: Secondary | ICD-10-CM | POA: Diagnosis not present

## 2018-08-02 DIAGNOSIS — D1800 Hemangioma unspecified site: Secondary | ICD-10-CM | POA: Diagnosis not present

## 2018-08-02 DIAGNOSIS — L820 Inflamed seborrheic keratosis: Secondary | ICD-10-CM | POA: Diagnosis not present

## 2018-08-02 DIAGNOSIS — Z85828 Personal history of other malignant neoplasm of skin: Secondary | ICD-10-CM | POA: Diagnosis not present

## 2018-08-02 DIAGNOSIS — L578 Other skin changes due to chronic exposure to nonionizing radiation: Secondary | ICD-10-CM | POA: Diagnosis not present

## 2018-08-02 DIAGNOSIS — D229 Melanocytic nevi, unspecified: Secondary | ICD-10-CM | POA: Diagnosis not present

## 2018-08-02 DIAGNOSIS — Z1283 Encounter for screening for malignant neoplasm of skin: Secondary | ICD-10-CM | POA: Diagnosis not present

## 2018-08-02 DIAGNOSIS — Z85820 Personal history of malignant melanoma of skin: Secondary | ICD-10-CM | POA: Diagnosis not present

## 2018-08-14 DIAGNOSIS — R350 Frequency of micturition: Secondary | ICD-10-CM | POA: Diagnosis not present

## 2018-08-14 DIAGNOSIS — R351 Nocturia: Secondary | ICD-10-CM | POA: Diagnosis not present

## 2018-08-14 DIAGNOSIS — N401 Enlarged prostate with lower urinary tract symptoms: Secondary | ICD-10-CM | POA: Diagnosis not present

## 2018-08-14 DIAGNOSIS — R102 Pelvic and perineal pain: Secondary | ICD-10-CM | POA: Diagnosis not present

## 2018-08-15 DIAGNOSIS — R102 Pelvic and perineal pain: Secondary | ICD-10-CM | POA: Diagnosis not present

## 2018-08-15 DIAGNOSIS — R350 Frequency of micturition: Secondary | ICD-10-CM | POA: Diagnosis not present

## 2018-08-15 DIAGNOSIS — R351 Nocturia: Secondary | ICD-10-CM | POA: Diagnosis not present

## 2018-08-15 DIAGNOSIS — N401 Enlarged prostate with lower urinary tract symptoms: Secondary | ICD-10-CM | POA: Diagnosis not present

## 2018-08-16 DIAGNOSIS — N3281 Overactive bladder: Secondary | ICD-10-CM | POA: Diagnosis not present

## 2018-08-16 DIAGNOSIS — R350 Frequency of micturition: Secondary | ICD-10-CM | POA: Diagnosis not present

## 2018-08-16 DIAGNOSIS — N401 Enlarged prostate with lower urinary tract symptoms: Secondary | ICD-10-CM | POA: Diagnosis not present

## 2018-08-20 ENCOUNTER — Encounter: Payer: Self-pay | Admitting: Gastroenterology

## 2018-09-12 DIAGNOSIS — L578 Other skin changes due to chronic exposure to nonionizing radiation: Secondary | ICD-10-CM | POA: Diagnosis not present

## 2018-09-12 DIAGNOSIS — L820 Inflamed seborrheic keratosis: Secondary | ICD-10-CM | POA: Diagnosis not present

## 2018-09-12 DIAGNOSIS — L720 Epidermal cyst: Secondary | ICD-10-CM | POA: Diagnosis not present

## 2018-09-12 DIAGNOSIS — L821 Other seborrheic keratosis: Secondary | ICD-10-CM | POA: Diagnosis not present

## 2018-10-05 ENCOUNTER — Ambulatory Visit
Admission: RE | Admit: 2018-10-05 | Discharge: 2018-10-05 | Disposition: A | Discharge status: Home / Self Care | Payer: PPO | Source: Ambulatory Visit | Attending: Internal Medicine | Admitting: Internal Medicine

## 2018-10-05 ENCOUNTER — Ambulatory Visit
Admission: RE | Admit: 2018-10-05 | Discharge: 2018-10-05 | Disposition: A | Discharge status: Home / Self Care | Payer: PPO | Attending: Internal Medicine | Admitting: Internal Medicine

## 2018-10-05 ENCOUNTER — Other Ambulatory Visit: Payer: Self-pay | Admitting: Cardiology

## 2018-10-05 ENCOUNTER — Other Ambulatory Visit: Payer: Self-pay | Admitting: Internal Medicine

## 2018-10-05 DIAGNOSIS — R0789 Other chest pain: Secondary | ICD-10-CM | POA: Diagnosis not present

## 2018-10-05 DIAGNOSIS — I10 Essential (primary) hypertension: Secondary | ICD-10-CM | POA: Diagnosis not present

## 2018-10-05 DIAGNOSIS — R079 Chest pain, unspecified: Secondary | ICD-10-CM | POA: Insufficient documentation

## 2018-10-05 DIAGNOSIS — C4370 Malignant melanoma of unspecified lower limb, including hip: Secondary | ICD-10-CM | POA: Diagnosis not present

## 2018-10-05 DIAGNOSIS — I119 Hypertensive heart disease without heart failure: Secondary | ICD-10-CM | POA: Diagnosis not present

## 2018-10-11 DIAGNOSIS — C4370 Malignant melanoma of unspecified lower limb, including hip: Secondary | ICD-10-CM | POA: Diagnosis not present

## 2018-10-11 DIAGNOSIS — I119 Hypertensive heart disease without heart failure: Secondary | ICD-10-CM | POA: Diagnosis not present

## 2018-10-11 DIAGNOSIS — I10 Essential (primary) hypertension: Secondary | ICD-10-CM | POA: Diagnosis not present

## 2018-10-11 DIAGNOSIS — Z0000 Encounter for general adult medical examination without abnormal findings: Secondary | ICD-10-CM | POA: Diagnosis not present

## 2018-10-11 DIAGNOSIS — R0789 Other chest pain: Secondary | ICD-10-CM | POA: Diagnosis not present

## 2018-11-02 DIAGNOSIS — I10 Essential (primary) hypertension: Secondary | ICD-10-CM | POA: Diagnosis not present

## 2018-11-02 DIAGNOSIS — I119 Hypertensive heart disease without heart failure: Secondary | ICD-10-CM | POA: Diagnosis not present

## 2018-11-02 DIAGNOSIS — C4370 Malignant melanoma of unspecified lower limb, including hip: Secondary | ICD-10-CM | POA: Diagnosis not present

## 2018-11-02 DIAGNOSIS — N401 Enlarged prostate with lower urinary tract symptoms: Secondary | ICD-10-CM | POA: Diagnosis not present

## 2018-11-02 DIAGNOSIS — R0789 Other chest pain: Secondary | ICD-10-CM | POA: Diagnosis not present

## 2018-11-07 ENCOUNTER — Encounter: Payer: Self-pay | Admitting: Gastroenterology

## 2018-11-07 DIAGNOSIS — R0789 Other chest pain: Secondary | ICD-10-CM | POA: Diagnosis not present

## 2018-11-07 DIAGNOSIS — I119 Hypertensive heart disease without heart failure: Secondary | ICD-10-CM | POA: Diagnosis not present

## 2018-11-07 DIAGNOSIS — R233 Spontaneous ecchymoses: Secondary | ICD-10-CM | POA: Diagnosis not present

## 2018-11-07 DIAGNOSIS — J209 Acute bronchitis, unspecified: Secondary | ICD-10-CM | POA: Diagnosis not present

## 2018-11-07 DIAGNOSIS — R05 Cough: Secondary | ICD-10-CM | POA: Diagnosis not present

## 2018-11-29 ENCOUNTER — Ambulatory Visit (AMBULATORY_SURGERY_CENTER): Payer: Self-pay | Admitting: *Deleted

## 2018-11-29 ENCOUNTER — Encounter: Payer: Self-pay | Admitting: Gastroenterology

## 2018-11-29 VITALS — Ht 72.0 in | Wt 203.0 lb

## 2018-11-29 DIAGNOSIS — Z86010 Personal history of colonic polyps: Secondary | ICD-10-CM

## 2018-11-29 DIAGNOSIS — Z800 Family history of malignant neoplasm of digestive organs: Secondary | ICD-10-CM

## 2018-11-29 MED ORDER — PEG-KCL-NACL-NASULF-NA ASC-C 140 G PO SOLR: 1.0000 | ORAL | 0 refills | Status: DC

## 2018-11-29 NOTE — Progress Notes (Signed)
No egg or soy allergy known to patient  No issues with past sedation with any surgeries  or procedures, no intubation problems  No diet pills per patient No home 02 use per patient  No blood thinners per patient  Pt denies issues with constipation  No A fib or A flutter  EMMI video sent to pt's e mail - pt declined  Plenvu sample Lot [id number]  Exp 01/2020- pt has HTA will not cover prep  Pt was a Olevia Perches pt- requesting Armbruster as wife and daughter see Dr Havery Moros - changed procedure date to 2-18 with Armbruster at 35 am - cancelled 2-19 with Danis

## 2018-12-11 ENCOUNTER — Ambulatory Visit (AMBULATORY_SURGERY_CENTER): Payer: PPO | Admitting: Gastroenterology

## 2018-12-11 ENCOUNTER — Encounter: Payer: Self-pay | Admitting: Gastroenterology

## 2018-12-11 VITALS — BP 96/60 | HR 58 | Temp 97.8°F | Resp 12 | Ht 72.0 in | Wt 203.0 lb

## 2018-12-11 DIAGNOSIS — D122 Benign neoplasm of ascending colon: Secondary | ICD-10-CM | POA: Diagnosis not present

## 2018-12-11 DIAGNOSIS — Z86010 Personal history of colonic polyps: Secondary | ICD-10-CM | POA: Diagnosis not present

## 2018-12-11 DIAGNOSIS — I10 Essential (primary) hypertension: Secondary | ICD-10-CM | POA: Diagnosis not present

## 2018-12-11 DIAGNOSIS — D127 Benign neoplasm of rectosigmoid junction: Secondary | ICD-10-CM | POA: Diagnosis not present

## 2018-12-11 MED ORDER — SODIUM CHLORIDE 0.9 % IV SOLN: 500.0000 mL | Freq: Once | INTRAVENOUS | Status: DC

## 2018-12-11 NOTE — Op Note (Signed)
[facility] Patient Name: [name] Procedure Date: 12/11/2018 9:22 AM MRN: [id number] Endoscopist: Remo Lipps P. Havery Moros , MD Age: 73 Referring MD:  Date of Birth: 1946/02/26 Gender: Male Account #: [id number] Procedure:                Colonoscopy Indications:              Surveillance: Personal history of adenomatous                            polyps on last colonoscopy 5 years ago Medicines:                Monitored Anesthesia Care Procedure:                Pre-Anesthesia Assessment:                           - Prior to the procedure, a History and Physical                            was performed, and patient medications and                            allergies were reviewed. The patient's tolerance of                            previous anesthesia was also reviewed. The risks                            and benefits of the procedure and the sedation                            options and risks were discussed with the patient.                            All questions were answered, and informed consent                            was obtained. Prior Anticoagulants: The patient has                            taken no previous anticoagulant or antiplatelet                            agents. ASA Grade Assessment: II - A patient with                            mild systemic disease. After reviewing the risks                            and benefits, the patient was deemed in                            satisfactory condition to undergo the procedure.  After obtaining informed consent, the colonoscope                            was passed under direct vision. Throughout the                            procedure, the patient's blood pressure, pulse, and                            oxygen saturations were monitored continuously. The                            Colonoscope was introduced through the anus and                            advanced to the the cecum,  identified by                            appendiceal orifice and ileocecal valve. The                            colonoscopy was performed without difficulty. The                            patient tolerated the procedure well. The quality                            of the bowel preparation was good. The ileocecal                            valve, appendiceal orifice, and rectum were                            photographed. Scope In: 9:26:06 AM Scope Out: 9:48:47 AM Scope Withdrawal Time: 0 hours 19 minutes 13 seconds  Total Procedure Duration: 0 hours 22 minutes 41 seconds  Findings:                 The perianal and digital rectal examinations were                            normal.                           A diminutive polyp was found in the ascending                            colon. The polyp was sessile. The polyp was removed                            with a cold biopsy forceps. Resection and retrieval                            were complete.  A diminutive polyp was found in the recto-sigmoid                            colon. The polyp was sessile. The polyp was removed                            with a cold biopsy forceps. Resection and retrieval                            were complete.                           Multiple small-mouthed diverticula were found in                            the sigmoid colon.                           Internal hemorrhoids were found during                            retroflexion. The hemorrhoids were moderate.                           The exam was otherwise without abnormality. Complications:            No immediate complications. Estimated blood loss:                            Minimal. Estimated Blood Loss:     Estimated blood loss was minimal. Impression:               - One diminutive polyp in the ascending colon,                            removed with a cold biopsy forceps. Resected and                             retrieved.                           - One diminutive polyp at the recto-sigmoid colon,                            removed with a cold biopsy forceps. Resected and                            retrieved.                           - Diverticulosis in the sigmoid colon.                           - Internal hemorrhoids.                           - The examination was otherwise normal.  Recommendation:           - Patient has a contact number available for                            emergencies. The signs and symptoms of potential                            delayed complications were discussed with the                            patient. Return to normal activities tomorrow.                            Written discharge instructions were provided to the                            patient.                           - Resume previous diet.                           - Continue present medications.                           - Await pathology results. Remo Lipps P. Armbruster, MD 12/11/2018 9:52:32 AM This report has been signed electronically.

## 2018-12-11 NOTE — Progress Notes (Signed)
Pt's states no medical or surgical changes since previsit or office visit. 

## 2018-12-11 NOTE — Progress Notes (Signed)
Report given to PACU, vss 

## 2018-12-11 NOTE — Patient Instructions (Signed)
Please read handouts provided. Continue present medications. Await pathology results.     YOU HAD AN ENDOSCOPIC PROCEDURE TODAY AT THE [facility]:   Refer to the procedure report that was given to you for any specific questions about what was found during the examination.  If the procedure report does not answer your questions, please call your gastroenterologist to clarify.  If you requested that your care partner not be given the details of your procedure findings, then the procedure report has been included in a sealed envelope for you to review at your convenience later.  YOU SHOULD EXPECT: Some feelings of bloating in the abdomen. Passage of more gas than usual.  Walking can help get rid of the air that was put into your GI tract during the procedure and reduce the bloating. If you had a lower endoscopy (such as a colonoscopy or flexible sigmoidoscopy) you may notice spotting of blood in your stool or on the toilet paper. If you underwent a bowel prep for your procedure, you may not have a normal bowel movement for a few days.  Please Note:  You might notice some irritation and congestion in your nose or some drainage.  This is from the oxygen used during your procedure.  There is no need for concern and it should clear up in a day or so.  SYMPTOMS TO REPORT IMMEDIATELY:   Following lower endoscopy (colonoscopy or flexible sigmoidoscopy):  Excessive amounts of blood in the stool  Significant tenderness or worsening of abdominal pains  Swelling of the abdomen that is new, acute  Fever of 100F or higher    For urgent or emergent issues, a gastroenterologist can be reached at any hour by calling [phone].   DIET:  We do recommend a small meal at first, but then you may proceed to your regular diet.  Drink plenty of fluids but you should avoid alcoholic beverages for 24 hours.  ACTIVITY:  You should plan to take it easy for the rest of today and you should NOT DRIVE  or use heavy machinery until tomorrow (because of the sedation medicines used during the test).    FOLLOW UP: Our staff will call the number listed on your records the next business day following your procedure to check on you and address any questions or concerns that you may have regarding the information given to you following your procedure. If we do not reach you, we will leave a message.  However, if you are feeling well and you are not experiencing any problems, there is no need to return our call.  We will assume that you have returned to your regular daily activities without incident.  If any biopsies were taken you will be contacted by phone or by letter within the next 1-3 weeks.  Please call us at [phone] if you have not heard about the biopsies in 3 weeks.    SIGNATURES/CONFIDENTIALITY: You and/or your care partner have signed paperwork which will be entered into your electronic medical record.  These signatures attest to the fact that that the information above on your After Visit Summary has been reviewed and is understood.  Full responsibility of the confidentiality of this discharge information lies with you and/or your care-partner. 

## 2018-12-11 NOTE — Progress Notes (Signed)
Called to room to assist during endoscopic procedure.  Patient ID and intended procedure confirmed with present staff. Received instructions for my participation in the procedure from the performing physician.  

## 2018-12-12 ENCOUNTER — Encounter: Payer: PPO | Admitting: Gastroenterology

## 2018-12-12 ENCOUNTER — Telehealth: Payer: Self-pay

## 2018-12-12 NOTE — Telephone Encounter (Signed)
  Follow up Call-  Call back number 12/11/2018  Post procedure Call Back phone  # [phone]  Permission to leave phone message Yes  Some recent data might be hidden     Patient questions:  Do you have a fever, pain , or abdominal swelling? No. Pain Score  0 *  Have you tolerated food without any problems? Yes.    Have you been able to return to your normal activities? Yes.    Do you have any questions about your discharge instructions: Diet   No. Medications  No. Follow up visit  No.  Do you have questions or concerns about your Care? No.  Actions: * If pain score is 4 or above: No action needed, pain <4.

## 2018-12-14 ENCOUNTER — Encounter: Payer: Self-pay | Admitting: Gastroenterology

## 2019-02-21 DIAGNOSIS — D225 Melanocytic nevi of trunk: Secondary | ICD-10-CM | POA: Diagnosis not present

## 2019-02-21 DIAGNOSIS — Z85820 Personal history of malignant melanoma of skin: Secondary | ICD-10-CM | POA: Diagnosis not present

## 2019-02-21 DIAGNOSIS — L820 Inflamed seborrheic keratosis: Secondary | ICD-10-CM | POA: Diagnosis not present

## 2019-02-21 DIAGNOSIS — D229 Melanocytic nevi, unspecified: Secondary | ICD-10-CM | POA: Diagnosis not present

## 2019-02-21 DIAGNOSIS — L720 Epidermal cyst: Secondary | ICD-10-CM | POA: Diagnosis not present

## 2019-02-21 DIAGNOSIS — L578 Other skin changes due to chronic exposure to nonionizing radiation: Secondary | ICD-10-CM | POA: Diagnosis not present

## 2019-02-21 DIAGNOSIS — D2230 Melanocytic nevi of unspecified part of face: Secondary | ICD-10-CM | POA: Diagnosis not present

## 2019-02-21 DIAGNOSIS — L812 Freckles: Secondary | ICD-10-CM | POA: Diagnosis not present

## 2019-02-21 DIAGNOSIS — L570 Actinic keratosis: Secondary | ICD-10-CM | POA: Diagnosis not present

## 2019-02-21 DIAGNOSIS — Z1283 Encounter for screening for malignant neoplasm of skin: Secondary | ICD-10-CM | POA: Diagnosis not present

## 2019-02-21 DIAGNOSIS — D1800 Hemangioma unspecified site: Secondary | ICD-10-CM | POA: Diagnosis not present

## 2019-02-21 DIAGNOSIS — Z85828 Personal history of other malignant neoplasm of skin: Secondary | ICD-10-CM | POA: Diagnosis not present

## 2019-05-02 DIAGNOSIS — Z20828 Contact with and (suspected) exposure to other viral communicable diseases: Secondary | ICD-10-CM | POA: Diagnosis not present

## 2019-05-22 ENCOUNTER — Other Ambulatory Visit: Payer: Self-pay

## 2019-06-17 DIAGNOSIS — L0211 Cutaneous abscess of neck: Secondary | ICD-10-CM | POA: Diagnosis not present

## 2019-06-21 DIAGNOSIS — H2513 Age-related nuclear cataract, bilateral: Secondary | ICD-10-CM | POA: Diagnosis not present

## 2019-08-13 DIAGNOSIS — D1800 Hemangioma unspecified site: Secondary | ICD-10-CM | POA: Diagnosis not present

## 2019-08-13 DIAGNOSIS — D229 Melanocytic nevi, unspecified: Secondary | ICD-10-CM | POA: Diagnosis not present

## 2019-08-13 DIAGNOSIS — L738 Other specified follicular disorders: Secondary | ICD-10-CM | POA: Diagnosis not present

## 2019-08-13 DIAGNOSIS — L820 Inflamed seborrheic keratosis: Secondary | ICD-10-CM | POA: Diagnosis not present

## 2019-08-13 DIAGNOSIS — L821 Other seborrheic keratosis: Secondary | ICD-10-CM | POA: Diagnosis not present

## 2019-08-13 DIAGNOSIS — L578 Other skin changes due to chronic exposure to nonionizing radiation: Secondary | ICD-10-CM | POA: Diagnosis not present

## 2019-08-13 DIAGNOSIS — Z1283 Encounter for screening for malignant neoplasm of skin: Secondary | ICD-10-CM | POA: Diagnosis not present

## 2019-08-13 DIAGNOSIS — Z85820 Personal history of malignant melanoma of skin: Secondary | ICD-10-CM | POA: Diagnosis not present

## 2019-08-13 DIAGNOSIS — D225 Melanocytic nevi of trunk: Secondary | ICD-10-CM | POA: Diagnosis not present

## 2019-08-13 DIAGNOSIS — D2230 Melanocytic nevi of unspecified part of face: Secondary | ICD-10-CM | POA: Diagnosis not present

## 2019-08-13 DIAGNOSIS — L814 Other melanin hyperpigmentation: Secondary | ICD-10-CM | POA: Diagnosis not present

## 2019-08-13 DIAGNOSIS — L570 Actinic keratosis: Secondary | ICD-10-CM | POA: Diagnosis not present

## 2019-12-03 DIAGNOSIS — E7849 Other hyperlipidemia: Secondary | ICD-10-CM | POA: Diagnosis not present

## 2019-12-03 DIAGNOSIS — R5381 Other malaise: Secondary | ICD-10-CM | POA: Diagnosis not present

## 2019-12-03 DIAGNOSIS — I10 Essential (primary) hypertension: Secondary | ICD-10-CM | POA: Diagnosis not present

## 2019-12-04 DIAGNOSIS — Z0000 Encounter for general adult medical examination without abnormal findings: Secondary | ICD-10-CM | POA: Diagnosis not present

## 2019-12-04 DIAGNOSIS — R233 Spontaneous ecchymoses: Secondary | ICD-10-CM | POA: Diagnosis not present

## 2019-12-04 DIAGNOSIS — I10 Essential (primary) hypertension: Secondary | ICD-10-CM | POA: Diagnosis not present

## 2019-12-04 DIAGNOSIS — R05 Cough: Secondary | ICD-10-CM | POA: Diagnosis not present

## 2019-12-04 DIAGNOSIS — I119 Hypertensive heart disease without heart failure: Secondary | ICD-10-CM | POA: Diagnosis not present

## 2019-12-09 DIAGNOSIS — C4370 Malignant melanoma of unspecified lower limb, including hip: Secondary | ICD-10-CM | POA: Diagnosis not present

## 2019-12-09 DIAGNOSIS — I119 Hypertensive heart disease without heart failure: Secondary | ICD-10-CM | POA: Diagnosis not present

## 2019-12-09 DIAGNOSIS — R233 Spontaneous ecchymoses: Secondary | ICD-10-CM | POA: Diagnosis not present

## 2019-12-09 DIAGNOSIS — N401 Enlarged prostate with lower urinary tract symptoms: Secondary | ICD-10-CM | POA: Diagnosis not present

## 2019-12-10 ENCOUNTER — Other Ambulatory Visit: Payer: Self-pay | Admitting: Internal Medicine

## 2019-12-10 DIAGNOSIS — N138 Other obstructive and reflux uropathy: Secondary | ICD-10-CM

## 2019-12-10 DIAGNOSIS — N4281 Prostatodynia syndrome: Secondary | ICD-10-CM

## 2019-12-10 DIAGNOSIS — N401 Enlarged prostate with lower urinary tract symptoms: Secondary | ICD-10-CM

## 2019-12-12 ENCOUNTER — Ambulatory Visit: Admission: RE | Admit: 2019-12-12 | Payer: PPO | Source: Ambulatory Visit

## 2019-12-12 ENCOUNTER — Ambulatory Visit
Admission: RE | Admit: 2019-12-12 | Discharge: 2019-12-12 | Disposition: A | Discharge status: Home / Self Care | Payer: PPO | Source: Ambulatory Visit | Attending: Internal Medicine | Admitting: Internal Medicine

## 2019-12-12 ENCOUNTER — Other Ambulatory Visit: Payer: Self-pay

## 2019-12-12 DIAGNOSIS — N401 Enlarged prostate with lower urinary tract symptoms: Secondary | ICD-10-CM | POA: Insufficient documentation

## 2019-12-12 DIAGNOSIS — N4281 Prostatodynia syndrome: Secondary | ICD-10-CM | POA: Insufficient documentation

## 2019-12-12 DIAGNOSIS — N281 Cyst of kidney, acquired: Secondary | ICD-10-CM | POA: Diagnosis not present

## 2019-12-12 DIAGNOSIS — N138 Other obstructive and reflux uropathy: Secondary | ICD-10-CM

## 2019-12-13 DIAGNOSIS — C4370 Malignant melanoma of unspecified lower limb, including hip: Secondary | ICD-10-CM | POA: Diagnosis not present

## 2019-12-13 DIAGNOSIS — R233 Spontaneous ecchymoses: Secondary | ICD-10-CM | POA: Diagnosis not present

## 2019-12-13 DIAGNOSIS — N401 Enlarged prostate with lower urinary tract symptoms: Secondary | ICD-10-CM | POA: Diagnosis not present

## 2019-12-13 DIAGNOSIS — R351 Nocturia: Secondary | ICD-10-CM | POA: Diagnosis not present

## 2019-12-16 DIAGNOSIS — I10 Essential (primary) hypertension: Secondary | ICD-10-CM | POA: Diagnosis not present

## 2019-12-16 DIAGNOSIS — N281 Cyst of kidney, acquired: Secondary | ICD-10-CM | POA: Diagnosis not present

## 2019-12-16 DIAGNOSIS — N400 Enlarged prostate without lower urinary tract symptoms: Secondary | ICD-10-CM | POA: Diagnosis not present

## 2019-12-17 DIAGNOSIS — N401 Enlarged prostate with lower urinary tract symptoms: Secondary | ICD-10-CM | POA: Diagnosis not present

## 2019-12-17 DIAGNOSIS — R3914 Feeling of incomplete bladder emptying: Secondary | ICD-10-CM | POA: Diagnosis not present

## 2019-12-30 DIAGNOSIS — N401 Enlarged prostate with lower urinary tract symptoms: Secondary | ICD-10-CM | POA: Diagnosis not present

## 2020-01-02 DIAGNOSIS — N401 Enlarged prostate with lower urinary tract symptoms: Secondary | ICD-10-CM | POA: Diagnosis not present

## 2020-01-07 DIAGNOSIS — N401 Enlarged prostate with lower urinary tract symptoms: Secondary | ICD-10-CM | POA: Diagnosis not present

## 2020-01-14 ENCOUNTER — Ambulatory Visit: Payer: Self-pay | Admitting: Urology

## 2020-01-23 ENCOUNTER — Other Ambulatory Visit: Payer: Self-pay | Admitting: Internal Medicine

## 2020-01-23 DIAGNOSIS — M545 Low back pain, unspecified: Secondary | ICD-10-CM

## 2020-02-05 ENCOUNTER — Ambulatory Visit: Payer: PPO

## 2020-02-05 ENCOUNTER — Encounter: Payer: Self-pay | Admitting: Dermatology

## 2020-02-05 ENCOUNTER — Ambulatory Visit: Payer: PPO | Admitting: Dermatology

## 2020-02-05 ENCOUNTER — Ambulatory Visit
Admission: RE | Admit: 2020-02-05 | Discharge: 2020-02-05 | Disposition: A | Discharge status: Home / Self Care | Payer: PPO | Source: Ambulatory Visit | Attending: Internal Medicine | Admitting: Internal Medicine

## 2020-02-05 ENCOUNTER — Other Ambulatory Visit: Payer: Self-pay

## 2020-02-05 DIAGNOSIS — D229 Melanocytic nevi, unspecified: Secondary | ICD-10-CM

## 2020-02-05 DIAGNOSIS — L578 Other skin changes due to chronic exposure to nonionizing radiation: Secondary | ICD-10-CM

## 2020-02-05 DIAGNOSIS — L570 Actinic keratosis: Secondary | ICD-10-CM

## 2020-02-05 DIAGNOSIS — L820 Inflamed seborrheic keratosis: Secondary | ICD-10-CM

## 2020-02-05 DIAGNOSIS — Z1283 Encounter for screening for malignant neoplasm of skin: Secondary | ICD-10-CM

## 2020-02-05 DIAGNOSIS — L821 Other seborrheic keratosis: Secondary | ICD-10-CM | POA: Diagnosis not present

## 2020-02-05 DIAGNOSIS — D1800 Hemangioma unspecified site: Secondary | ICD-10-CM

## 2020-02-05 DIAGNOSIS — Z85828 Personal history of other malignant neoplasm of skin: Secondary | ICD-10-CM | POA: Diagnosis not present

## 2020-02-05 DIAGNOSIS — I872 Venous insufficiency (chronic) (peripheral): Secondary | ICD-10-CM | POA: Diagnosis not present

## 2020-02-05 DIAGNOSIS — M545 Low back pain, unspecified: Secondary | ICD-10-CM

## 2020-02-05 DIAGNOSIS — L814 Other melanin hyperpigmentation: Secondary | ICD-10-CM

## 2020-02-05 DIAGNOSIS — Z85820 Personal history of malignant melanoma of skin: Secondary | ICD-10-CM

## 2020-02-05 NOTE — Progress Notes (Signed)
Follow-Up Visit   Subjective  [name] is a 74 y.o. male who presents for the following: Annual Exam (History Melanoma of left shoulder (2009), left temple (2019)), Other (Spot of right collarbone x few months that gets irritated.), and Other (Few rough spots of face x a few months.).  Patient presents for skin cancer screening and total-body skin exam.  The following portions of the chart were reviewed this encounter and updated as appropriate: Tobacco  Allergies  Meds  Problems  Med Hx  Surg Hx  Fam Hx      Review of Systems: No other skin or systemic complaints.  Objective  Well appearing patient in no apparent distress; mood and affect are within normal limits.  A full examination was performed including scalp, head, eyes, ears, nose, lips, neck, chest, axillae, abdomen, back, buttocks, bilateral upper extremities, bilateral lower extremities, hands, feet, fingers, toes, fingernails, and toenails. All findings within normal limits unless otherwise noted below.  Objective  Face, neck (15): Erythematous thin papules/macules with gritty scale.   Objective  Left temple: Well healed scar with no evidence of recurrence, no lymphadenopathy.   Objective  Left top of shoulder: Well healed scar with no evidence of recurrence, no lymphadenopathy.   Objective  Left Upper Arm - Anterior, Neck - Anterior (2): Erythematous keratotic or waxy stuck-on papule or plaque.   Objective  Bilateral lower legs: Stasis changes.  Assessment & Plan    Skin cancer screening performed today.  Actinic Damage - diffuse scaly erythematous macules with underlying dyspigmentation - Recommend daily broad spectrum sunscreen SPF 30+ to sun-exposed areas, reapply every 2 hours as needed.  - Call for new or changing lesions. -We will consider field treatment with PDT or topical agents in the future for precancerous aks  Seborrheic Keratoses - Stuck-on, waxy, tan-brown papules and plaques    - Discussed benign etiology and prognosis. - Observe - Call for any changes  Lentigines - Scattered tan macules - Discussed due to sun exposure - Benign, observe - Call for any changes  Hemangiomas - Red papules - Discussed benign nature - Observe - Call for any changes  Melanocytic Nevi - Tan-brown and/or pink-flesh-colored symmetric macules and papules - Benign appearing on exam today - Observation - Call clinic for new or changing moles - Recommend daily use of broad spectrum spf 30+ sunscreen to sun-exposed areas.    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 Observe for recurrence. Call clinic for new or changing lesions.  Recommend regular skin exams, daily broad-spectrum spf 30+ sunscreen use, and photoprotection.    Observe   History of melanoma Left top of shoulder Clear. Observe for recurrence. Call clinic for new or changing lesions.  Recommend regular skin exams, daily broad-spectrum spf 30+ sunscreen use, and photoprotection.    Observe   Inflamed seborrheic keratosis (3) Neck - Anterior (2); Left Upper Arm - Anterior  Destruction of lesion - Neck - Anterior Complexity: simple  Destruction method: cryotherapy   Informed consent: discussed and consent obtained   Timeout:  patient name, date of birth, surgical site, and procedure verified Lesion destroyed using liquid nitrogen: Yes   Region frozen until ice  ball extended beyond lesion: Yes   Outcome: patient tolerated procedure well with no complications   Post-procedure details: wound care instructions given    Venous stasis dermatitis of right lower extremity Bilateral lower legs  Stasis changes of bilateral lower legs.  Return in about 6 months (around 08/06/2020) for AK follow up.   I, Ashok Cordia, CMA, am acting as scribe for Sarina Ser, MD . Documentation: I have reviewed the above documentation for accuracy and completeness, and I agree with the above.  Sarina Ser, MD

## 2020-02-05 NOTE — Patient Instructions (Signed)

## 2020-02-17 ENCOUNTER — Other Ambulatory Visit: Payer: PPO

## 2020-03-02 ENCOUNTER — Telehealth: Payer: Self-pay | Admitting: *Deleted

## 2020-03-02 DIAGNOSIS — F40240 Claustrophobia: Secondary | ICD-10-CM

## 2020-03-02 NOTE — Telephone Encounter (Signed)
Patient called stating that he is having an MRI done on Thursday and he states that he is claustrophobic and is asking if he can get medication to help with it during MRI

## 2020-03-04 MED ORDER — ALPRAZOLAM 0.25 MG PO TABS: 0.2500 mg | ORAL_TABLET | Freq: Two times a day (BID) | ORAL | 0 refills | Status: DC | PRN

## 2020-03-04 NOTE — Telephone Encounter (Signed)
Patient says that he he gets claustrophobic when he gets into any kind of machine MRI or CT scan so he was given Xanax 1 tablet twice a day as needed 10 pills were ordered

## 2020-03-04 NOTE — Telephone Encounter (Signed)
Patient notified of rx

## 2020-03-05 ENCOUNTER — Other Ambulatory Visit: Payer: Self-pay

## 2020-03-05 ENCOUNTER — Ambulatory Visit
Admission: RE | Admit: 2020-03-05 | Discharge: 2020-03-05 | Disposition: A | Discharge status: Home / Self Care | Payer: PPO | Source: Ambulatory Visit | Attending: Internal Medicine | Admitting: Internal Medicine

## 2020-03-05 ENCOUNTER — Other Ambulatory Visit: Payer: PPO

## 2020-03-05 ENCOUNTER — Telehealth: Payer: Self-pay | Admitting: Internal Medicine

## 2020-03-05 DIAGNOSIS — M545 Low back pain, unspecified: Secondary | ICD-10-CM

## 2020-03-05 NOTE — Telephone Encounter (Signed)
[name] went for Mri was not able to do the closed MRI patient is going to have to be sedated so this will need to be rescheduled if possible in [place] if not can it be done with [facility] .  Patient can do May 21 Or June 1,2,3,4

## 2020-03-06 NOTE — Telephone Encounter (Signed)
Patient will need apt with Dr Lavera Guise before this can be reschedule. Please call patient and schedule.

## 2020-03-30 ENCOUNTER — Ambulatory Visit: Payer: PPO | Admitting: Internal Medicine

## 2020-04-06 ENCOUNTER — Ambulatory Visit: Payer: PPO | Admitting: Internal Medicine

## 2020-04-17 ENCOUNTER — Other Ambulatory Visit: Payer: Self-pay

## 2020-04-17 MED ORDER — ROSUVASTATIN CALCIUM 10 MG PO TABS: 10.0000 mg | ORAL_TABLET | Freq: Every day | ORAL | 2 refills | Status: DC

## 2020-06-29 IMAGING — CR DG CHEST 2V
1 series · 2 of 2 positions shown · non-contrast
Comparison: Chest x-ray [DATE].

CLINICAL DATA: Left-sided chest pain.

EXAM:
CHEST - 2 VIEW

[Series 1: dg chest 2 view · 0.14mm/px · 2 of 2 slices shown]
[im 1/2]
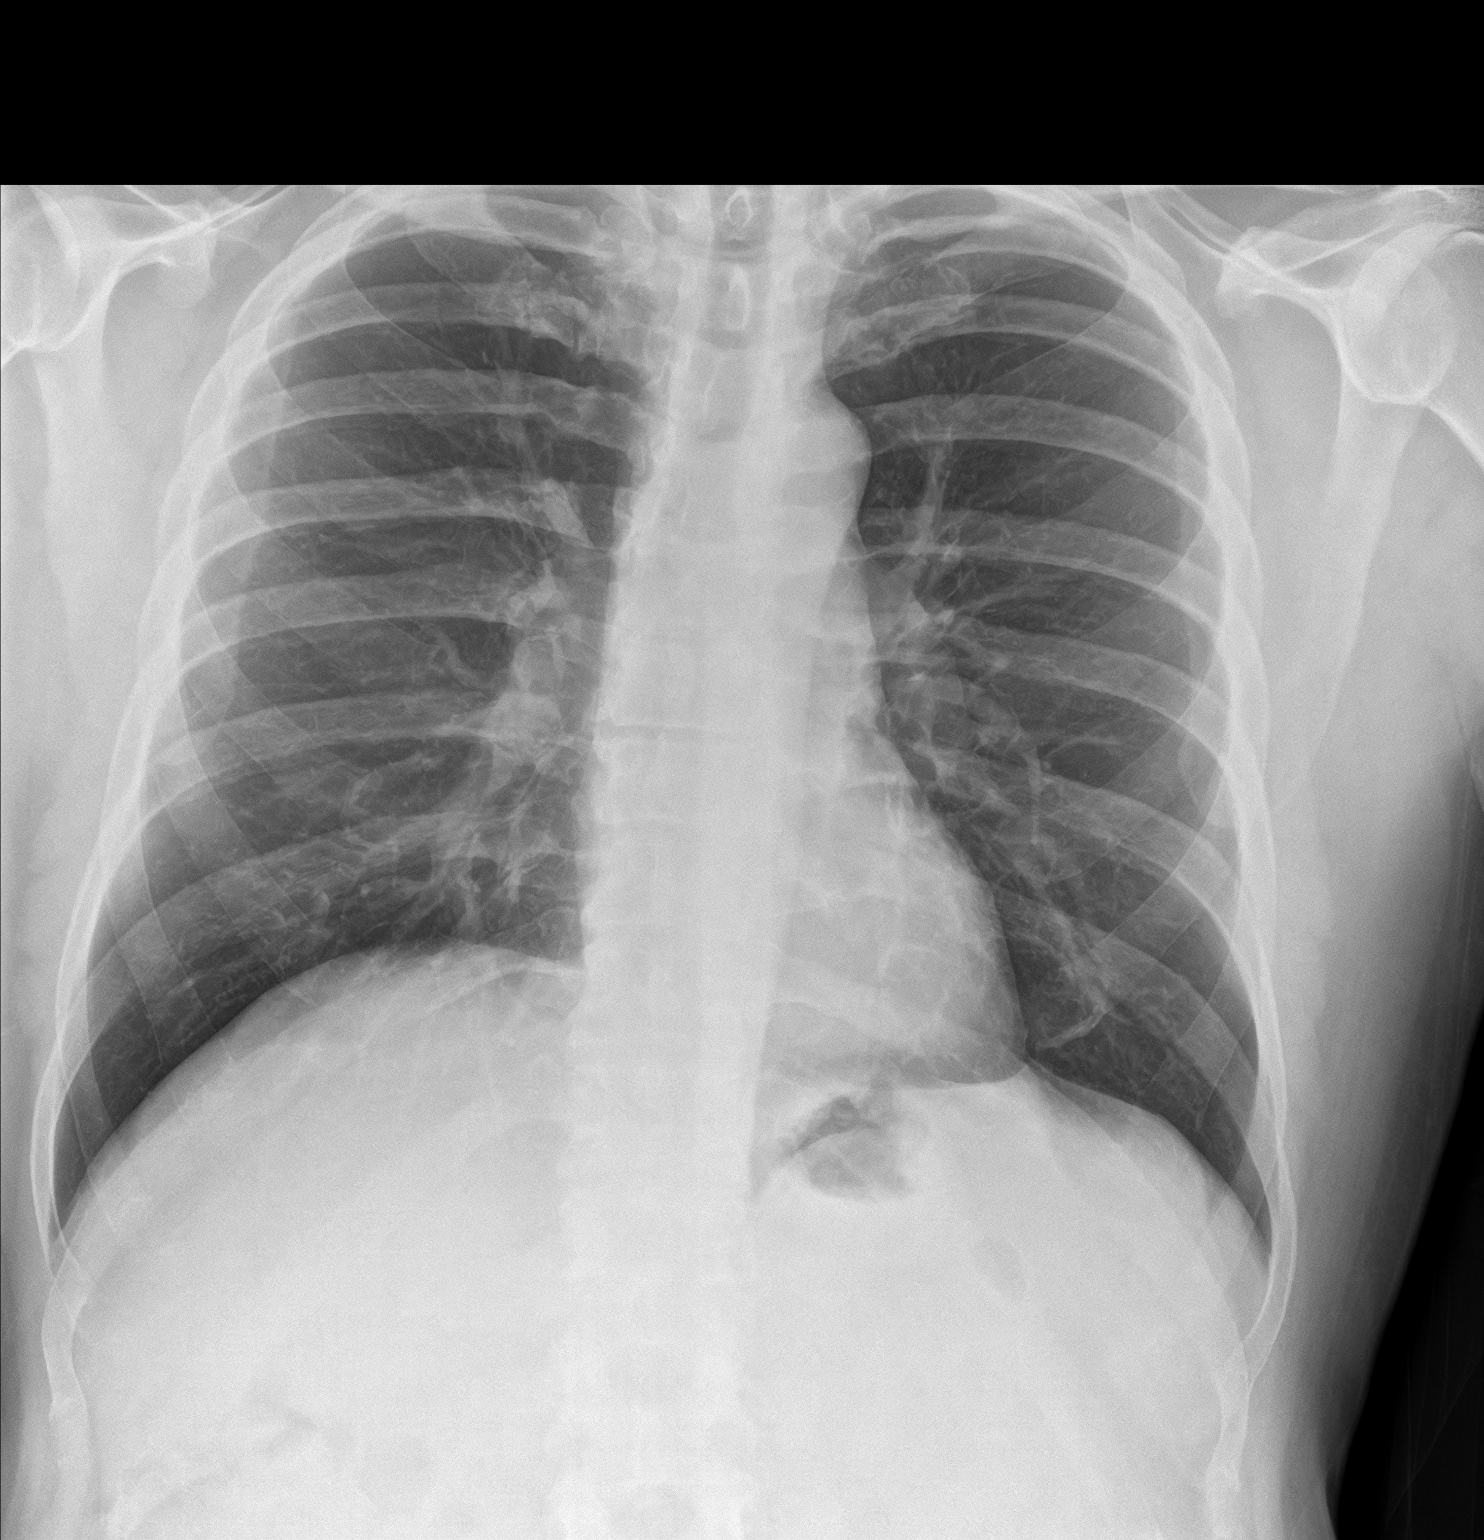
[im 2/2]
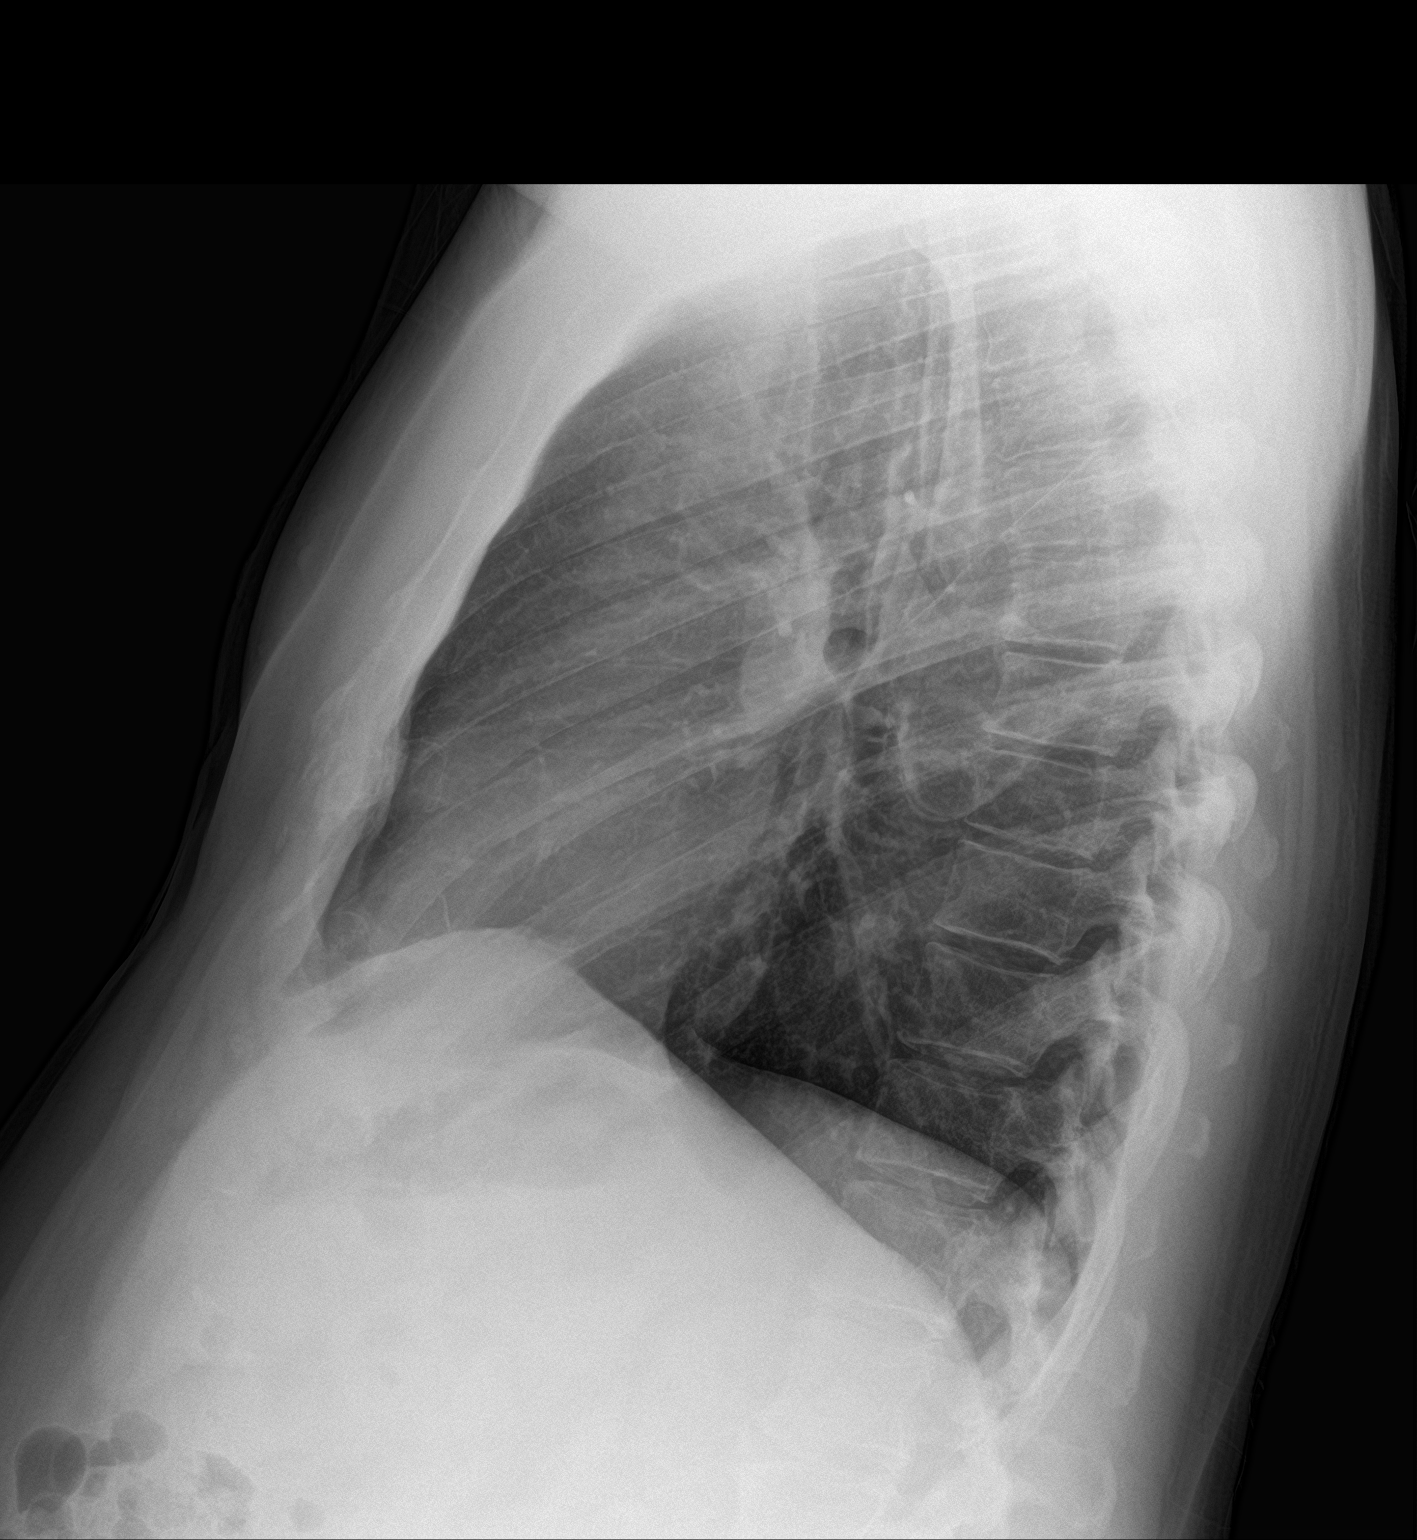

[2 of 2 positions shown; findings below may reference images not displayed]

FINDINGS: Mediastinum and hilar structures normal. Lungs are clear. No pleural
effusion or pneumothorax. Heart size normal. Degenerative changes
scoliosis thoracic spine.
IMPRESSION: No acute cardiopulmonary disease.

## 2020-07-08 ENCOUNTER — Other Ambulatory Visit: Payer: Self-pay

## 2020-07-08 MED ORDER — AMLODIPINE BESYLATE 5 MG PO TABS: 5.0000 mg | ORAL_TABLET | Freq: Every day | ORAL | 3 refills | Status: DC

## 2020-08-12 ENCOUNTER — Other Ambulatory Visit: Payer: Self-pay

## 2020-08-12 ENCOUNTER — Ambulatory Visit: Payer: PPO | Admitting: Dermatology

## 2020-08-12 ENCOUNTER — Encounter: Payer: Self-pay | Admitting: Dermatology

## 2020-08-12 DIAGNOSIS — Z85820 Personal history of malignant melanoma of skin: Secondary | ICD-10-CM

## 2020-08-12 DIAGNOSIS — D485 Neoplasm of uncertain behavior of skin: Secondary | ICD-10-CM | POA: Diagnosis not present

## 2020-08-12 DIAGNOSIS — C4441 Basal cell carcinoma of skin of scalp and neck: Secondary | ICD-10-CM | POA: Diagnosis not present

## 2020-08-12 DIAGNOSIS — Z1283 Encounter for screening for malignant neoplasm of skin: Secondary | ICD-10-CM | POA: Diagnosis not present

## 2020-08-12 DIAGNOSIS — L578 Other skin changes due to chronic exposure to nonionizing radiation: Secondary | ICD-10-CM

## 2020-08-12 DIAGNOSIS — C4491 Basal cell carcinoma of skin, unspecified: Secondary | ICD-10-CM

## 2020-08-12 DIAGNOSIS — L821 Other seborrheic keratosis: Secondary | ICD-10-CM

## 2020-08-12 DIAGNOSIS — L820 Inflamed seborrheic keratosis: Secondary | ICD-10-CM

## 2020-08-12 DIAGNOSIS — L570 Actinic keratosis: Secondary | ICD-10-CM

## 2020-08-12 DIAGNOSIS — L814 Other melanin hyperpigmentation: Secondary | ICD-10-CM

## 2020-08-12 DIAGNOSIS — D229 Melanocytic nevi, unspecified: Secondary | ICD-10-CM | POA: Diagnosis not present

## 2020-08-12 DIAGNOSIS — D1800 Hemangioma unspecified site: Secondary | ICD-10-CM

## 2020-08-12 HISTORY — DX: Basal cell carcinoma of skin, unspecified: C44.91

## 2020-08-12 NOTE — Patient Instructions (Signed)

## 2020-08-12 NOTE — Progress Notes (Signed)
Follow-Up Visit   Subjective  [name] is a 74 y.o. male who presents for the following: Follow-up (AKs of face and neck treated with LN2 x 15. History of Melanoma and SCC - USBE today). The patient presents for Upper Body Skin Exam (UBSE) for skin cancer screening and mole check.  The following portions of the chart were reviewed this encounter and updated as appropriate:  Tobacco  Allergies  Meds  Problems  Med Hx  Surg Hx  Fam Hx     Review of Systems:  No other skin or systemic complaints except as noted in HPI or Assessment and Plan.  Objective  Well appearing patient in no apparent distress; mood and affect are within normal limits.  All skin waist up examined.  Objective  Left Forehead x 1, right sideburn x 1, arms x 2, right scalp x 1 (5): Erythematous keratotic or waxy stuck-on papule or plaque.   Objective  Face: Erythematous thin papules/macules with gritty scale.   Objective  Left post lat base of neck: 0.6 cm pink papule        Assessment & Plan    History of Melanoma - No evidence of recurrence today - No lymphadenopathy - Recommend regular full body skin exams - Recommend daily broad spectrum sunscreen SPF 30+ to sun-exposed areas, reapply every 2 hours as needed.  - Call if any new or changing lesions are noted between office visits  Lentigines - Scattered tan macules - Discussed due to sun exposure - Benign, observe - Call for any changes  Seborrheic Keratoses - Stuck-on, waxy, tan-brown papules and plaques  - Discussed benign etiology and prognosis. - Observe - Call for any changes  Melanocytic Nevi - Tan-brown and/or pink-flesh-colored symmetric macules and papules - Benign appearing on exam today - Observation - Call clinic for new or changing moles - Recommend daily use of broad spectrum spf 30+ sunscreen to sun-exposed areas.   Hemangiomas - Red papules - Discussed benign nature - Observe - Call for any  changes  Actinic Damage - diffuse scaly erythematous macules with underlying dyspigmentation - Recommend daily broad spectrum sunscreen SPF 30+ to sun-exposed areas, reapply every 2 hours as needed.  - Call for new or changing lesions.  Skin cancer screening performed today.  Inflamed seborrheic keratosis (5) Left Forehead x 1, right sideburn x 1, arms x 2, right scalp x 1  Destruction of lesion - Left Forehead x 1, right sideburn x 1, arms x 2, right scalp x 1 Complexity: simple   Destruction method: cryotherapy   Informed consent: discussed and consent obtained   Timeout:  patient name, date of birth, surgical site, and procedure verified Lesion destroyed using liquid nitrogen: Yes   Region frozen until ice ball extended beyond lesion: Yes   Outcome: patient tolerated procedure well with no complications   Post-procedure details: wound care instructions given    AK (actinic keratosis) Face  Destruction of lesion - Face Complexity: simple   Destruction method: cryotherapy   Informed consent: discussed and consent obtained   Timeout:  patient name, date of birth, surgical site, and procedure verified Lesion destroyed using liquid nitrogen: Yes   Region frozen until ice ball extended beyond lesion: Yes   Outcome: patient tolerated procedure well with no complications   Post-procedure details: wound care instructions given    Neoplasm of uncertain behavior of skin Left post lat base of neck  Epidermal / dermal shaving  Lesion diameter (cm):  0.6 Informed  consent: discussed and consent obtained   Timeout: patient name, date of birth, surgical site, and procedure verified   Procedure prep:  Patient was prepped and draped in usual sterile fashion Prep type:  Isopropyl alcohol Anesthesia: the lesion was anesthetized in a standard fashion   Anesthetic:  1% lidocaine w/ epinephrine 1-100,000 buffered w/ 8.4% NaHCO3 Instrument used: flexible razor blade   Hemostasis achieved  with: pressure, aluminum chloride and electrodesiccation   Outcome: patient tolerated procedure well   Post-procedure details: sterile dressing applied and wound care instructions given   Dressing type: bandage and petrolatum    Specimen 1 - Surgical pathology Differential Diagnosis: R/O BCC Check Margins: No 0.6 cm pink papule  Skin cancer screening  Return in about 6 months (around 02/10/2021) for TBSE.   I, Ashok Cordia, CMA, am acting as scribe for Sarina Ser, MD .  Documentation: I have reviewed the above documentation for accuracy and completeness, and I agree with the above.  Sarina Ser, MD

## 2020-08-17 ENCOUNTER — Telehealth: Payer: Self-pay

## 2020-08-17 NOTE — Telephone Encounter (Signed)
Informed pt of results and scheduled EDC.  

## 2020-08-17 NOTE — Telephone Encounter (Signed)
-----   Message from Ralene Bathe, MD sent at 08/17/2020 10:28 AM EDT ----- Skin , left post lateral base of neck BASAL CELL CARCINOMA, NODULAR PATTERN  Cancer - BCC Schedule for treatment (EDC)

## 2020-09-22 ENCOUNTER — Other Ambulatory Visit: Payer: Self-pay | Admitting: *Deleted

## 2020-09-22 MED ORDER — LOSARTAN POTASSIUM 100 MG PO TABS: 100.0000 mg | ORAL_TABLET | Freq: Every day | ORAL | 3 refills | Status: DC

## 2020-09-28 ENCOUNTER — Ambulatory Visit: Payer: PPO | Admitting: Dermatology

## 2020-09-28 ENCOUNTER — Other Ambulatory Visit: Payer: Self-pay

## 2020-09-28 DIAGNOSIS — L578 Other skin changes due to chronic exposure to nonionizing radiation: Secondary | ICD-10-CM | POA: Diagnosis not present

## 2020-09-28 DIAGNOSIS — C4441 Basal cell carcinoma of skin of scalp and neck: Secondary | ICD-10-CM

## 2020-09-28 NOTE — Progress Notes (Signed)
   Follow-Up Visit   Subjective  [name] is a 74 y.o. male who presents for the following: Skin Cancer (Pt here for treatment BCC nodular pattern bx 08-12-2020 left post lateral base of neck).  The following portions of the chart were reviewed this encounter and updated as appropriate:   Tobacco  Allergies  Meds  Problems  Med Hx  Surg Hx  Fam Hx     Review of Systems:  No other skin or systemic complaints except as noted in HPI or Assessment and Plan.  Objective  Well appearing patient in no apparent distress; mood and affect are within normal limits.  A focused examination was performed including L neck, face . Relevant physical exam findings are noted in the Assessment and Plan.  Objective  left posterior lateral base of neck: Pink pearly papule or plaque with arborizing vessels.    Assessment & Plan  Basal cell carcinoma (BCC) of skin of neck left posterior lateral base of neck  Destruction of lesion Complexity: extensive   Destruction method: electrodesiccation and curettage   Informed consent: discussed and consent obtained   Timeout:  patient name, date of birth, surgical site, and procedure verified Procedure prep:  Patient was prepped and draped in usual sterile fashion Prep type:  Isopropyl alcohol Anesthesia: the lesion was anesthetized in a standard fashion   Anesthetic:  1% lidocaine w/ epinephrine 1-100,000 buffered w/ 8.4% NaHCO3 Curettage performed in three different directions: Yes   Electrodesiccation performed over the curetted area: Yes   Final wound size (cm):  1.5 Hemostasis achieved with:  pressure, aluminum chloride and electrodesiccation Outcome: patient tolerated procedure well with no complications   Post-procedure details: sterile dressing applied and wound care instructions given   Dressing type: bandage and petrolatum    Actinic Damage - chronic, secondary to cumulative UV radiation exposure/sun exposure over time - diffuse scaly  erythematous macules with underlying dyspigmentation - Recommend daily broad spectrum sunscreen SPF 30+ to sun-exposed areas, reapply every 2 hours as needed.  - Call for new or changing lesions. Return for as scheduled in April 2022.  IMarye Round, CMA, am acting as scribe for Sarina Ser, MD .  Documentation: I have reviewed the above documentation for accuracy and completeness, and I agree with the above.  Sarina Ser, MD

## 2020-09-28 NOTE — Patient Instructions (Signed)

## 2020-10-05 ENCOUNTER — Encounter: Payer: Self-pay | Admitting: Dermatology

## 2020-12-07 DIAGNOSIS — H5213 Myopia, bilateral: Secondary | ICD-10-CM | POA: Diagnosis not present

## 2021-01-12 ENCOUNTER — Other Ambulatory Visit: Payer: Self-pay | Admitting: Internal Medicine

## 2021-02-17 ENCOUNTER — Encounter: Payer: Self-pay | Admitting: Dermatology

## 2021-02-17 ENCOUNTER — Ambulatory Visit: Payer: PPO | Admitting: Dermatology

## 2021-02-17 ENCOUNTER — Other Ambulatory Visit: Payer: Self-pay

## 2021-02-17 DIAGNOSIS — Z1283 Encounter for screening for malignant neoplasm of skin: Secondary | ICD-10-CM | POA: Diagnosis not present

## 2021-02-17 DIAGNOSIS — D1800 Hemangioma unspecified site: Secondary | ICD-10-CM | POA: Diagnosis not present

## 2021-02-17 DIAGNOSIS — C4441 Basal cell carcinoma of skin of scalp and neck: Secondary | ICD-10-CM

## 2021-02-17 DIAGNOSIS — Z85820 Personal history of malignant melanoma of skin: Secondary | ICD-10-CM | POA: Diagnosis not present

## 2021-02-17 DIAGNOSIS — L820 Inflamed seborrheic keratosis: Secondary | ICD-10-CM

## 2021-02-17 DIAGNOSIS — L821 Other seborrheic keratosis: Secondary | ICD-10-CM | POA: Diagnosis not present

## 2021-02-17 DIAGNOSIS — L603 Nail dystrophy: Secondary | ICD-10-CM | POA: Diagnosis not present

## 2021-02-17 DIAGNOSIS — C4491 Basal cell carcinoma of skin, unspecified: Secondary | ICD-10-CM

## 2021-02-17 DIAGNOSIS — D229 Melanocytic nevi, unspecified: Secondary | ICD-10-CM | POA: Diagnosis not present

## 2021-02-17 DIAGNOSIS — Z85828 Personal history of other malignant neoplasm of skin: Secondary | ICD-10-CM | POA: Diagnosis not present

## 2021-02-17 DIAGNOSIS — C44219 Basal cell carcinoma of skin of left ear and external auricular canal: Secondary | ICD-10-CM | POA: Diagnosis not present

## 2021-02-17 DIAGNOSIS — L578 Other skin changes due to chronic exposure to nonionizing radiation: Secondary | ICD-10-CM

## 2021-02-17 DIAGNOSIS — L814 Other melanin hyperpigmentation: Secondary | ICD-10-CM

## 2021-02-17 DIAGNOSIS — L570 Actinic keratosis: Secondary | ICD-10-CM | POA: Diagnosis not present

## 2021-02-17 DIAGNOSIS — D492 Neoplasm of unspecified behavior of bone, soft tissue, and skin: Secondary | ICD-10-CM

## 2021-02-17 HISTORY — DX: Basal cell carcinoma of skin, unspecified: C44.91

## 2021-02-17 NOTE — Patient Instructions (Addendum)
If you have any questions or concerns for your doctor, please call our main line at [phone] and press option 4 to reach your doctor's medical assistant. If no one answers, please leave a voicemail as directed and we will return your call as soon as possible. Messages left after 4 pm will be answered the following business day.   You may also send [place] a message via [place]. We typically respond to MyChart messages within 1-2 business days.  For prescription refills, please ask your pharmacy to contact our office. Our fax number is [phone].  If you have an urgent issue when the clinic is closed that cannot wait until the next business day, you can page your doctor at the number below.    Please note that while we do our best to be available for urgent issues outside of office hours, we are not available 24/7.   If you have an urgent issue and are unable to reach [place], you may choose to seek medical care at your doctor's office, retail clinic, urgent care center, or emergency room.  If you have a medical emergency, please immediately call 911 or go to the emergency department.  Pager Numbers  - Dr. Nehemiah Massed: [phone]  - Dr. Laurence Ferrari: [phone]  - Dr. Nicole Kindred: [phone]  In the event of inclement weather, please call our main line at [phone] for an update on the status of any delays or closures.  Dermatology Medication Tips: Please keep the boxes that topical medications come in in order to help keep track of the instructions about where and how to use these. Pharmacies typically print the medication instructions only on the boxes and not directly on the medication tubes.   If your medication is too expensive, please contact our office at [phone] option 4 or send [place] a message through [place].   We are unable to tell what your co-pay for medications will be in advance as this is different depending on your insurance coverage. However, we may be able to find a substitute  medication at lower cost or fill out paperwork to get insurance to cover a needed medication.   If a prior authorization is required to get your medication covered by your insurance company, please allow [place] 1-2 business days to complete this process.  Drug prices often vary depending on where the prescription is filled and some pharmacies may offer cheaper prices.  The website www.goodrx.com contains coupons for medications through different pharmacies. The prices here do not account for what the cost may be with help from insurance (it may be cheaper with your insurance), but the website can give you the price if you did not use any insurance.  - You can print the associated coupon and take it with your prescription to the pharmacy.  - You may also stop by our office during regular business hours and pick up a GoodRx coupon card.  - If you need your prescription sent electronically to a different pharmacy, notify our office through [facility] or by phone at [phone] option 4.   Cryotherapy Aftercare  . Wash gently with soap and water everyday.   Marland Kitchen Apply Vaseline and Band-Aid daily until healed.   Wound Care Instructions  1. Cleanse wound gently with soap and water once a day then pat dry with clean gauze. Apply a thing coat of Petrolatum (petroleum jelly, "Vaseline") over the wound (unless you have an allergy to this). We recommend that you use a new, sterile tube of Vaseline.  Do not pick or remove scabs. Do not remove the yellow or white "healing tissue" from the base of the wound.  2. Cover the wound with fresh, clean, nonstick gauze and secure with paper tape. You may use Band-Aids in place of gauze and tape if the would is small enough, but would recommend trimming much of the tape off as there is often too much. Sometimes Band-Aids can irritate the skin.  3. You should call the office for your biopsy report after 1 week if you have not already been contacted.  4. If you  experience any problems, such as abnormal amounts of bleeding, swelling, significant bruising, significant pain, or evidence of infection, please call the office immediately.  5. FOR ADULT SURGERY PATIENTS: If you need something for pain relief you may take 1 extra strength Tylenol (acetaminophen) AND 2 Ibuprofen (200mg  each) together every 4 hours as needed for pain. (do not take these if you are allergic to them or if you have a reason you should not take them.) Typically, you may only need pain medication for 1 to 3 days.

## 2021-02-17 NOTE — Progress Notes (Signed)
Follow-Up Visit   Subjective  [name] is a 75 y.o. male who presents for the following: Total body skin exam (Hx of Melanoma, BCC, SCC, AKs). The patient presents for Total-Body Skin Exam (TBSE) for skin cancer screening and mole check.  The following portions of the chart were reviewed this encounter and updated as appropriate:   Tobacco  Allergies  Meds  Problems  Med Hx  Surg Hx  Fam Hx     Review of Systems:  No other skin or systemic complaints except as noted in HPI or Assessment and Plan.  Objective  Well appearing patient in no apparent distress; mood and affect are within normal limits.  A full examination was performed including scalp, head, eyes, ears, nose, lips, neck, chest, axillae, abdomen, back, buttocks, bilateral upper extremities, bilateral lower extremities, hands, feet, fingers, toes, fingernails, and toenails. All findings within normal limits unless otherwise noted below.  Objective  L post base of neck: Well healed scar with no evidence of recurrence.   Objective  Left top of shoulder: Well healed scar with no evidence of recurrence, no lymphadenopathy.   Objective  L temple anterior to sideburn: Well healed scar with no evidence of recurrence, no lymphadenopathy.   Objective  back x 1, R bicep x 1, sternum x 1, R dorsum hand x 4, L neck x 6 (13): Erythematous keratotic or waxy stuck-on papule or plaque.   Objective  face x 8 (8): Pink scaly macules   Objective  Right great toenail: Subungual hematoma  Objective  L infra auricular: Indurated pap 1.1cm        Assessment & Plan    Lentigines - Scattered tan macules - Due to sun exposure - Benign-appering, observe - Recommend daily broad spectrum sunscreen SPF 30+ to sun-exposed areas, reapply every 2 hours as needed. - Call for any changes  Seborrheic Keratoses - Stuck-on, waxy, tan-brown papules and/or plaques  - Benign-appearing - Discussed benign etiology and  prognosis. - Observe - Call for any changes  Melanocytic Nevi - Tan-brown and/or pink-flesh-colored symmetric macules and papules - Benign appearing on exam today - Observation - Call clinic for new or changing moles - Recommend daily use of broad spectrum spf 30+ sunscreen to sun-exposed areas.   Hemangiomas - Red papules - Discussed benign nature - Observe - Call for any changes  Actinic Damage - Chronic condition, secondary to cumulative UV/sun exposure - diffuse scaly erythematous macules with underlying dyspigmentation - Recommend daily broad spectrum sunscreen SPF 30+ to sun-exposed areas, reapply every 2 hours as needed.  - Staying in the shade or wearing long sleeves, sun glasses (UVA+UVB protection) and wide brim hats (4-inch brim around the entire circumference of the hat) are also recommended for sun protection.  - Call for new or changing lesions.  Skin cancer screening performed today.  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 Observe for recurrence. Call clinic for new or changing lesions.  Recommend regular skin exams, daily broad-spectrum spf 30+ sunscreen use, and photoprotection.     History of melanoma Left top of shoulder 2009 Clear.  No lymphadenopathy.  Observe for recurrence. Call clinic for new or changing lesions.  Recommend regular skin exams, daily broad-spectrum spf 30+ sunscreen use, and photoprotection.     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 Observe for recurrence. Call clinic for new or changing lesions.  Recommend regular skin exams, daily broad-spectrum spf  30+ sunscreen use, and photoprotection.     Inflamed seborrheic keratosis (13) back x 1, R bicep x 1, sternum x 1, R dorsum hand x 4, L neck x 6 Destruction of lesion - back x 1, R bicep x 1, sternum x 1, R dorsum hand x 4, L neck x 6 Complexity: simple   Destruction method: cryotherapy   Informed consent:  discussed and consent obtained   Timeout:  patient name, date of birth, surgical site, and procedure verified Lesion destroyed using liquid nitrogen: Yes   Region frozen until ice ball extended beyond lesion: Yes   Outcome: patient tolerated procedure well with no complications   Post-procedure details: wound care instructions given    AK (actinic keratosis) (8) face x 8 Actinic keratoses are precancerous spots that appear secondary to cumulative UV radiation exposure/sun exposure over time. They are chronic with expected duration over 1 year. A portion of actinic keratoses will progress to squamous cell carcinoma of the skin. It is not possible to reliably predict which spots will progress to skin cancer and so treatment is recommended to prevent development of skin cancer.  Recommend daily broad spectrum sunscreen SPF 30+ to sun-exposed areas, reapply every 2 hours as needed.  Recommend staying in the shade or wearing long sleeves, sun glasses (UVA+UVB protection) and wide brim hats (4-inch brim around the entire circumference of the hat). Call for new or changing lesions.  Destruction of lesion - face x 8 Complexity: simple   Destruction method: cryotherapy   Informed consent: discussed and consent obtained   Timeout:  patient name, date of birth, surgical site, and procedure verified Lesion destroyed using liquid nitrogen: Yes   Region frozen until ice ball extended beyond lesion: Yes   Outcome: patient tolerated procedure well with no complications   Post-procedure details: wound care instructions given    Nail dystrophy Right great toenail 2ndary to Trauma, benign, observe  Neoplasm of skin L infra auricular Skin / nail biopsy Type of biopsy: tangential   Informed consent: discussed and consent obtained   Timeout: patient name, date of birth, surgical site, and procedure verified   Procedure prep:  Patient was prepped and draped in usual sterile fashion Prep type:  Isopropyl  alcohol Anesthesia: the lesion was anesthetized in a standard fashion   Anesthetic:  1% lidocaine w/ epinephrine 1-100,000 buffered w/ 8.4% NaHCO3 Instrument used: flexible razor blade   Outcome: patient tolerated procedure well   Post-procedure details: sterile dressing applied and wound care instructions given   Dressing type: bandage and bacitracin    Specimen 1 - Surgical pathology Differential Diagnosis: D48.5 R/O BCC  Check Margins: No Indurated pap 1.1cm  R/O BCC - L infra auricular, bx today  Skin cancer screening  Return in about 6 months (around 08/19/2021) for UBSE, Hx of Melanoma, Hx of BCC, Hx of SCC, Hx of AKs.  I, Othelia Pulling, RMA, am acting as scribe for Sarina Ser, MD .  Documentation: I have reviewed the above documentation for accuracy and completeness, and I agree with the above.  Sarina Ser, MD

## 2021-02-18 ENCOUNTER — Encounter: Payer: Self-pay | Admitting: Dermatology

## 2021-02-19 ENCOUNTER — Telehealth: Payer: Self-pay

## 2021-02-19 NOTE — Telephone Encounter (Signed)
Patient informed of pathology results and surgery scheduled. 

## 2021-02-19 NOTE — Telephone Encounter (Signed)
-----   Message from Ralene Bathe, MD sent at 02/18/2021  5:50 PM EDT ----- Diagnosis Skin , left infra auricular BASAL CELL CARCINOMA, SUPERFICIAL, NODULAR AND INFILTRATIVE PATTERNS, BASE INVOLVED  Cancer - BCC Schedule surgery

## 2021-03-02 ENCOUNTER — Ambulatory Visit: Payer: PPO | Admitting: Dermatology

## 2021-03-02 ENCOUNTER — Other Ambulatory Visit: Payer: Self-pay

## 2021-03-02 ENCOUNTER — Encounter: Payer: Self-pay | Admitting: Dermatology

## 2021-03-02 ENCOUNTER — Telehealth: Payer: Self-pay

## 2021-03-02 DIAGNOSIS — C44319 Basal cell carcinoma of skin of other parts of face: Secondary | ICD-10-CM | POA: Diagnosis not present

## 2021-03-02 DIAGNOSIS — C4441 Basal cell carcinoma of skin of scalp and neck: Secondary | ICD-10-CM | POA: Diagnosis not present

## 2021-03-02 MED ORDER — MUPIROCIN 2 % EX OINT: 1.0000 "application " | TOPICAL_OINTMENT | Freq: Every day | CUTANEOUS | 0 refills | Status: DC

## 2021-03-02 NOTE — Progress Notes (Signed)
   Follow-Up Visit   Subjective  [name] is a 75 y.o. male who presents for the following: BCC bx proven (L NECK, infra auricular, pt presents for excision).  The following portions of the chart were reviewed this encounter and updated as appropriate:   Tobacco  Allergies  Meds  Problems  Med Hx  Surg Hx  Fam Hx     Review of Systems:  No other skin or systemic complaints except as noted in HPI or Assessment and Plan.  Objective  Well appearing patient in no apparent distress; mood and affect are within normal limits.  A focused examination was performed including face. Relevant physical exam findings are noted in the Assessment and Plan.  Objective  Left infra auricular: Pink bx site 2.1 x 1.2cm   Assessment & Plan  Basal cell carcinoma (BCC) of skin of neck Left NECK, infra auricular  Skin excision  Lesion length (cm):  2.1 Lesion width (cm):  1.2 Margin per side (cm):  0.2 Total excision diameter (cm):  2.5 Informed consent: discussed and consent obtained   Timeout: patient name, date of birth, surgical site, and procedure verified   Procedure prep:  Patient was prepped and draped in usual sterile fashion Prep type:  Isopropyl alcohol and povidone-iodine Anesthesia: the lesion was anesthetized in a standard fashion   Anesthetic:  1% lidocaine w/ epinephrine 1-100,000 buffered w/ 8.4% NaHCO3 (7cc) Instrument used comment:  #15c blade Hemostasis achieved with: pressure   Hemostasis achieved with comment:  Electrocautery Outcome: patient tolerated procedure well with no complications   Post-procedure details: sterile dressing applied and wound care instructions given   Dressing type: bandage and pressure dressing (Mupirocin)    Skin repair Complexity:  Complex Final length (cm):  3.5 Reason for type of repair: reduce tension to allow closure, reduce the risk of dehiscence, infection, and necrosis, reduce subcutaneous dead space and avoid a hematoma, allow  closure of the large defect, preserve normal anatomy, preserve normal anatomical and functional relationships and enhance both functionality and cosmetic results   Undermining: area extensively undermined   Undermining comment:  Undermining Defect 2.5cm Subcutaneous layers (deep stitches):  Suture size:  4-0 Suture type: Vicryl (polyglactin 910)   Subcutaneous suture technique: Inverted Dermal. Fine/surface layer approximation (top stitches):  Suture size:  4-0 Suture type: nylon   Stitches: horizontal mattress   Suture removal (days):  7 Hemostasis achieved with: pressure Outcome: patient tolerated procedure well with no complications   Post-procedure details: sterile dressing applied and wound care instructions given   Dressing type: bandage, pressure dressing and bacitracin (Mupirocin)    mupirocin ointment (BACTROBAN) 2 %  Specimen 1 - Surgical pathology Differential Diagnosis: Bx proven BCC  Check Margins: yes Pink bx site 2.1 x 1.2cm  Bx proven  Start mupirocin oint qd to excision site  Advised pt to d/c aspirin for 2 weeks  Return in about 2 days (around 03/04/2021) for dressing change, 7 days sr.  I, Sonya Hupman, RMA, am acting as scribe for Sarina Ser, MD .  Documentation: I have reviewed the above documentation for accuracy and completeness, and I agree with the above.  Sarina Ser, MD

## 2021-03-02 NOTE — Telephone Encounter (Signed)
Patient doing fine after today's surgery./sh 

## 2021-03-02 NOTE — Patient Instructions (Signed)
If you have any questions or concerns for your doctor, please call our main line at [phone] and press option 4 to reach your doctor's medical assistant. If no one answers, please leave a voicemail as directed and we will return your call as soon as possible. Messages left after 4 pm will be answered the following business day.   You may also send us a message via MyChart. We typically respond to MyChart messages within 1-2 business days.  For prescription refills, please ask your pharmacy to contact our office. Our fax number is [phone].  If you have an urgent issue when the clinic is closed that cannot wait until the next business day, you can page your doctor at the number below.    Please note that while we do our best to be available for urgent issues outside of office hours, we are not available 24/7.   If you have an urgent issue and are unable to reach us, you may choose to seek medical care at your doctor's office, retail clinic, urgent care center, or emergency room.  If you have a medical emergency, please immediately call 911 or go to the emergency department.  Pager Numbers  - Dr. Kowalski: [phone]  - Dr. Moye: [phone]  - Dr. Stewart: [phone]  In the event of inclement weather, please call our main line at [phone] for an update on the status of any delays or closures.  Dermatology Medication Tips: Please keep the boxes that topical medications come in in order to help keep track of the instructions about where and how to use these. Pharmacies typically print the medication instructions only on the boxes and not directly on the medication tubes.   If your medication is too expensive, please contact our office at [phone] option 4 or send us a message through MyChart.   We are unable to tell what your co-pay for medications will be in advance as this is different depending on your insurance coverage. However, we may be able to find a  substitute medication at lower cost or fill out paperwork to get insurance to cover a needed medication.   If a prior authorization is required to get your medication covered by your insurance company, please allow us 1-2 business days to complete this process.  Drug prices often vary depending on where the prescription is filled and some pharmacies may offer cheaper prices.  The website www.goodrx.com contains coupons for medications through different pharmacies. The prices here do not account for what the cost may be with help from insurance (it may be cheaper with your insurance), but the website can give you the price if you did not use any insurance.  - You can print the associated coupon and take it with your prescription to the pharmacy.  - You may also stop by our office during regular business hours and pick up a GoodRx coupon card.  - If you need your prescription sent electronically to a different pharmacy, notify our office through [facility] MyChart or by phone at [phone] option 4.     Wound Care Instructions  1. Cleanse wound gently with soap and water once a day then pat dry with clean gauze. Apply a thing coat of Petrolatum (petroleum jelly, "Vaseline") over the wound (unless you have an allergy to this). We recommend that you use a new, sterile tube of Vaseline. Do not pick or remove scabs. Do not remove the yellow or white "healing tissue" from the base of the wound.    2. Cover the wound with fresh, clean, nonstick gauze and secure with paper tape. You may use Band-Aids in place of gauze and tape if the would is small enough, but would recommend trimming much of the tape off as there is often too much. Sometimes Band-Aids can irritate the skin.  3. You should call the office for your biopsy report after 1 week if you have not already been contacted.  4. If you experience any problems, such as abnormal amounts of bleeding, swelling, significant bruising, significant pain,  or evidence of infection, please call the office immediately.  5. FOR ADULT SURGERY PATIENTS: If you need something for pain relief you may take 1 extra strength Tylenol (acetaminophen) AND 2 Ibuprofen (200mg each) together every 4 hours as needed for pain. (do not take these if you are allergic to them or if you have a reason you should not take them.) Typically, you may only need pain medication for 1 to 3 days.     

## 2021-03-04 ENCOUNTER — Other Ambulatory Visit: Payer: Self-pay

## 2021-03-04 ENCOUNTER — Ambulatory Visit (INDEPENDENT_AMBULATORY_CARE_PROVIDER_SITE_OTHER): Payer: PPO

## 2021-03-04 DIAGNOSIS — C4441 Basal cell carcinoma of skin of scalp and neck: Secondary | ICD-10-CM

## 2021-03-04 DIAGNOSIS — Z4801 Encounter for change or removal of surgical wound dressing: Secondary | ICD-10-CM

## 2021-03-04 NOTE — Progress Notes (Signed)
Patient here for two day dressing change. Excision site healing well. Patients dressing removed. Cleaned with Puracyn. Mupirocin Ointment applied along with clean telfa and dressing tape. Patient doing well with no complaints.

## 2021-03-09 ENCOUNTER — Other Ambulatory Visit: Payer: Self-pay

## 2021-03-09 ENCOUNTER — Ambulatory Visit (INDEPENDENT_AMBULATORY_CARE_PROVIDER_SITE_OTHER): Payer: PPO | Admitting: Dermatology

## 2021-03-09 ENCOUNTER — Encounter: Payer: Self-pay | Admitting: Dermatology

## 2021-03-09 DIAGNOSIS — C4441 Basal cell carcinoma of skin of scalp and neck: Secondary | ICD-10-CM

## 2021-03-09 NOTE — Progress Notes (Signed)
   Follow-Up Visit   Subjective  [name] is a 75 y.o. male who presents for the following: BCC margins free, bx proven (L infra auricular, 1 wk f/u, pt presents for suture removal).  The following portions of the chart were reviewed this encounter and updated as appropriate:   Tobacco  Allergies  Meds  Problems  Med Hx  Surg Hx  Fam Hx     Review of Systems:  No other skin or systemic complaints except as noted in HPI or Assessment and Plan.  Objective  Well appearing patient in no apparent distress; mood and affect are within normal limits.  A focused examination was performed including L neck. Relevant physical exam findings are noted in the Assessment and Plan.  Objective  L infra auricular: Healing excision site   Assessment & Plan  Basal cell carcinoma (BCC) of skin of neck L infra auricular  Margins free, bx proven  Encounter for Removal of Sutures - Incision site at the L infra auricular is clean, dry and intact - Wound cleansed, sutures removed, wound cleansed and steri strips applied.  - Discussed pathology results showing BCC margins free  - Patient advised to keep steri-strips dry until they fall off. - Scars remodel for a full year. - Once steri-strips fall off, patient can apply over-the-counter silicone scar cream each night to help with scar remodeling if desired. - Patient advised to call with any concerns or if they notice any new or changing lesions.    Discussed Nicotinamide 500mg  twice per day to lower risk of non-melanoma skin cancer by approximately 25%.    Discussed Heliocare, info given  Other Related Medications mupirocin ointment (BACTROBAN) 2 %  Return for as scheduled.   I, Othelia Pulling, RMA, am acting as scribe for Sarina Ser, MD .  Documentation: I have reviewed the above documentation for accuracy and completeness, and I agree with the above.  Sarina Ser, MD

## 2021-03-09 NOTE — Patient Instructions (Addendum)
If you have any questions or concerns for your doctor, please call our main line at [phone] and press option 4 to reach your doctor's medical assistant. If no one answers, please leave a voicemail as directed and we will return your call as soon as possible. Messages left after 4 pm will be answered the following business day.   You may also send [place] a message via [place]. We typically respond to MyChart messages within 1-2 business days.  For prescription refills, please ask your pharmacy to contact our office. Our fax number is [phone].  If you have an urgent issue when the clinic is closed that cannot wait until the next business day, you can page your doctor at the number below.    Please note that while we do our best to be available for urgent issues outside of office hours, we are not available 24/7.   If you have an urgent issue and are unable to reach [place], you may choose to seek medical care at your doctor's office, retail clinic, urgent care center, or emergency room.  If you have a medical emergency, please immediately call 911 or go to the emergency department.  Pager Numbers  - Dr. Nehemiah Massed: [phone]  - Dr. Laurence Ferrari: [phone]  - Dr. Nicole Kindred: [phone]  In the event of inclement weather, please call our main line at [phone] for an update on the status of any delays or closures.  Dermatology Medication Tips: Please keep the boxes that topical medications come in in order to help keep track of the instructions about where and how to use these. Pharmacies typically print the medication instructions only on the boxes and not directly on the medication tubes.   If your medication is too expensive, please contact our office at [phone] option 4 or send [place] a message through [place].   We are unable to tell what your co-pay for medications will be in advance as this is different depending on your insurance coverage. However, we may be able to find a substitute  medication at lower cost or fill out paperwork to get insurance to cover a needed medication.   If a prior authorization is required to get your medication covered by your insurance company, please allow [place] 1-2 business days to complete this process.  Drug prices often vary depending on where the prescription is filled and some pharmacies may offer cheaper prices.  The website www.goodrx.com contains coupons for medications through different pharmacies. The prices here do not account for what the cost may be with help from insurance (it may be cheaper with your insurance), but the website can give you the price if you did not use any insurance.  - You can print the associated coupon and take it with your prescription to the pharmacy.  - You may also stop by our office during regular business hours and pick up a GoodRx coupon card.  - If you need your prescription sent electronically to a different pharmacy, notify our office through [facility] or by phone at [phone] option 4.    Recommend taking Heliocare sun protection supplement daily in sunny weather for additional sun protection. For maximum protection on the sunniest days, you can take up to 2 capsules of regular Heliocare OR take 1 capsule of Heliocare Ultra. For prolonged exposure (such as a full day in the sun), you can repeat your dose of the supplement 4 hours after your first dose. Heliocare can be purchased at [facility] or at  [email or web address].   Recommend Nicotinamide 500mg  twice per day to lower risk of non-melanoma skin cancer by approximately 25%.

## 2021-03-19 ENCOUNTER — Encounter: Payer: Self-pay | Admitting: Dermatology

## 2021-03-27 ENCOUNTER — Other Ambulatory Visit: Payer: Self-pay | Admitting: Internal Medicine

## 2021-06-20 ENCOUNTER — Other Ambulatory Visit: Payer: Self-pay | Admitting: Internal Medicine

## 2021-08-04 ENCOUNTER — Other Ambulatory Visit: Payer: Self-pay

## 2021-08-04 ENCOUNTER — Ambulatory Visit (INDEPENDENT_AMBULATORY_CARE_PROVIDER_SITE_OTHER): Payer: PPO | Admitting: *Deleted

## 2021-08-04 DIAGNOSIS — Z0000 Encounter for general adult medical examination without abnormal findings: Secondary | ICD-10-CM

## 2021-08-05 LAB — HEMOGLOBIN A1C
Hgb A1c MFr Bld: 5.3 % of total Hgb (ref <5.7)
Mean Plasma Glucose: 105 mg/dL
eAG (mmol/L): 5.8 mmol/L

## 2021-08-05 LAB — TSH: TSH: 0.98 mIU/L (ref 0.40–4.50)

## 2021-08-05 LAB — COMPLETE METABOLIC PANEL WITH GFR
AG Ratio: 1.8 (calc) (ref 1.0–2.5)
ALT: 27 U/L (ref 9–46)
AST: 27 U/L (ref 10–35)
Albumin: 4.6 g/dL (ref 3.6–5.1)
Alkaline phosphatase (APISO): 52 U/L (ref 35–144)
BUN: 14 mg/dL (ref 7–25)
CO2: 27 mmol/L (ref 20–32)
Calcium: 9.6 mg/dL (ref 8.6–10.3)
Chloride: 102 mmol/L (ref 98–110)
Creat: 0.94 mg/dL (ref 0.70–1.28)
Globulin: 2.5 g/dL (calc) (ref 1.9–3.7)
Glucose, Bld: 87 mg/dL (ref 65–99)
Potassium: 4.3 mmol/L (ref 3.5–5.3)
Sodium: 140 mmol/L (ref 135–146)
Total Bilirubin: 1.1 mg/dL (ref 0.2–1.2)
Total Protein: 7.1 g/dL (ref 6.1–8.1)
eGFR: 86 mL/min/{1.73_m2} (ref >=60)

## 2021-08-05 LAB — CBC WITH DIFFERENTIAL/PLATELET
Absolute Monocytes: 451 cells/uL (ref 200–950)
Basophils Absolute: 49 cells/uL (ref 0–200)
Basophils Relative: 0.8 %
Eosinophils Absolute: 189 cells/uL (ref 15–500)
Eosinophils Relative: 3.1 %
HCT: 45.2 % (ref 38.5–50.0)
Hemoglobin: 15.2 g/dL (ref 13.2–17.1)
Lymphs Abs: 2611 cells/uL (ref 850–3900)
MCH: 31.7 pg (ref 27.0–33.0)
MCHC: 33.6 g/dL (ref 32.0–36.0)
MCV: 94.4 fL (ref 80.0–100.0)
MPV: 10.4 fL (ref 7.5–12.5)
Monocytes Relative: 7.4 %
Neutro Abs: 2800 cells/uL (ref 1500–7800)
Neutrophils Relative %: 45.9 %
Platelets: 220 10*3/uL (ref 140–400)
RBC: 4.79 10*6/uL (ref 4.20–5.80)
RDW: 13 % (ref 11.0–15.0)
Total Lymphocyte: 42.8 %
WBC: 6.1 10*3/uL (ref 3.8–10.8)

## 2021-08-05 LAB — LIPID PANEL
Cholesterol: 147 mg/dL (ref <200)
HDL: 65 mg/dL (ref >=40)
LDL Cholesterol (Calc): 68 mg/dL (calc)
Non-HDL Cholesterol (Calc): 82 mg/dL (calc) (ref <130)
Total CHOL/HDL Ratio: 2.3 (calc) (ref <5.0)
Triglycerides: 62 mg/dL (ref <150)

## 2021-08-05 LAB — PSA: PSA: 1.88 ng/mL (ref <=4.00)

## 2021-08-10 ENCOUNTER — Other Ambulatory Visit: Payer: Self-pay

## 2021-08-10 ENCOUNTER — Encounter: Payer: Self-pay | Admitting: Internal Medicine

## 2021-08-10 ENCOUNTER — Ambulatory Visit (INDEPENDENT_AMBULATORY_CARE_PROVIDER_SITE_OTHER): Payer: PPO | Admitting: Internal Medicine

## 2021-08-10 VITALS — BP 153/84 | HR 96 | Ht 72.0 in | Wt 199.9 lb

## 2021-08-10 DIAGNOSIS — E785 Hyperlipidemia, unspecified: Secondary | ICD-10-CM | POA: Diagnosis not present

## 2021-08-10 DIAGNOSIS — Z0000 Encounter for general adult medical examination without abnormal findings: Secondary | ICD-10-CM

## 2021-08-10 DIAGNOSIS — N401 Enlarged prostate with lower urinary tract symptoms: Secondary | ICD-10-CM | POA: Diagnosis not present

## 2021-08-10 DIAGNOSIS — Z23 Encounter for immunization: Secondary | ICD-10-CM | POA: Diagnosis not present

## 2021-08-10 DIAGNOSIS — R351 Nocturia: Secondary | ICD-10-CM

## 2021-08-10 MED ORDER — TAMSULOSIN HCL 0.4 MG PO CAPS: 0.4000 mg | ORAL_CAPSULE | Freq: Every day | ORAL | 3 refills | Status: DC

## 2021-08-10 NOTE — Assessment & Plan Note (Signed)
Hypercholesterolemia  I advised the patient to follow Mediterranean diet This diet is rich in fruits vegetables and whole grain, and This diet is also rich in fish and lean meat Patient should also eat a handful of almonds or walnuts daily Recent heart study indicated that average follow-up on this kind of diet reduces the cardiovascular mortality by 50 to 70%== 

## 2021-08-10 NOTE — Assessment & Plan Note (Signed)
Patient denies any chest pain or shortness of breath heart is regular chest is clear abdomen is soft nontender without any hepatosplenomegaly.  Rectal examination revealed normal sphincter tone prostate is bilobed normal without any nodule.  There is no pedal edema no calf tenderness.  Neurological examination is nonfocal.

## 2021-08-10 NOTE — Assessment & Plan Note (Signed)
Started on Flomax again patient is nocturia 3 times at night, PSA is normal, prostate examination is normal

## 2021-08-10 NOTE — Progress Notes (Signed)
Established Patient Office Visit  Subjective:  Patient ID: [name], male    DOB: 1946/01/01  Age: 75 y.o. MRN: [id number]  CC:  Chief Complaint  Patient presents with   Annual Exam    HPI  [name] presents for check up   and physical  Past Medical History:  Diagnosis Date   Actinic keratosis    Allergy    Arthritis    Basal cell carcinoma 08/12/2020   L post lat base of neck    BCC (basal cell carcinoma of skin) 02/17/2021   L infra auricular, exc 03/02/21   History of colon polyps    Hyperlipidemia    Hypertension    Melanoma ([place]) 2009   left top of shoulder. Dr. Koleen Nimrod   Seasonal allergies    Squamous cell carcinoma of skin 11/02/2017   left temple anterior to sideburn. [facility] 11/02/2017    Past Surgical History:  Procedure Laterality Date   COLONOSCOPY     MELANOMA EXCISION  2009   POLYPECTOMY     TOTAL KNEE ARTHROPLASTY Right 2011   TOTAL KNEE ARTHROPLASTY Left     Family History  Problem Relation Age of Onset   Colon cancer Mother        died age 64-dx'd late 7-0's   Esophageal cancer Neg Hx    Rectal cancer Neg Hx    Stomach cancer Neg Hx    Crohn's disease Neg Hx     Social History   Socioeconomic History   Marital status: Married    Spouse name: Not on file   Number of children: Not on file   Years of education: Not on file   Highest education level: Not on file  Occupational History   Not on file  Tobacco Use   Smoking status: Former    Types: Cigarettes    Quit date: 10/24/1972    Years since quitting: 48.8   Smokeless tobacco: Former    Quit date: 10/24/2002  Substance and Sexual Activity   Alcohol use: Yes    Alcohol/week: 3.0 standard drinks    Types: 3 Glasses of wine per week   Drug use: No   Sexual activity: Not on file  Other Topics Concern   Not on file  Social History Narrative   Not on file   Social Determinants of Health   Financial Resource Strain: Not on file  Food Insecurity: Not on file   Transportation Needs: Not on file  Physical Activity: Not on file  Stress: Not on file  Social Connections: Not on file  Intimate Partner Violence: Not on file     Current Outpatient Medications:    ALPRAZolam (XANAX) 0.25 MG tablet, Take 1 tablet (0.25 mg total) by mouth 2 (two) times daily as needed for anxiety., Disp: 10 tablet, Rfl: 0   amLODipine (NORVASC) 5 MG tablet, TAKE 1 TABLET BY MOUTH EVERY DAY, Disp: 90 tablet, Rfl: 3   aspirin 81 MG tablet, Take 81 mg by mouth daily., Disp: , Rfl:    COD LIVER OIL PO, Take 1 capsule by mouth daily., Disp: , Rfl:    cyclobenzaprine (FLEXERIL) 10 MG tablet, TAKE 1 TABLET BY MOUTH AT BEDTIME, TAKE 1/2 TABLET AT BEDTIME, Disp: , Rfl:    levofloxacin (LEVAQUIN) 500 MG tablet, Take 500 mg by mouth daily., Disp: , Rfl:    losartan (COZAAR) 100 MG tablet, TAKE 1 TABLET BY MOUTH EVERY DAY, Disp: 90 tablet, Rfl: 3   milk thistle  175 MG tablet, Take 250 mg by mouth daily., Disp: , Rfl:    Multiple Vitamin (MULTIVITAMIN) tablet, Take by mouth., Disp: , Rfl:    Multiple Vitamins-Minerals (CENTRUM SILVER PO), Take by mouth., Disp: , Rfl:    mupirocin ointment (BACTROBAN) 2 %, Apply 1 application topically daily. Qd to excision site, Disp: 22 g, Rfl: 0   phenazopyridine (PYRIDIUM) 100 MG tablet, Take 100 mg by mouth 2 (two) times daily., Disp: , Rfl:    Psyllium (METAMUCIL PO), Take 2 scoop by mouth daily., Disp: , Rfl:    rosuvastatin (CRESTOR) 10 MG tablet, TAKE 1 TABLET BY MOUTH EVERY DAY, Disp: 90 tablet, Rfl: 2   tamsulosin (FLOMAX) 0.4 MG CAPS capsule, , Disp: , Rfl:    tamsulosin (FLOMAX) 0.4 MG CAPS capsule, Take 1 capsule (0.4 mg total) by mouth daily., Disp: 90 capsule, Rfl: 3   thiamine (VITAMIN B-1) 100 MG tablet, Take 100 mg by mouth daily., Disp: , Rfl:    No Known Allergies  ROS Review of Systems  Constitutional: Negative.   HENT: Negative.    Eyes: Negative.   Respiratory: Negative.    Cardiovascular: Negative.   Gastrointestinal:  Negative.   Endocrine: Negative.   Genitourinary: Negative.   Musculoskeletal: Negative.   Skin: Negative.   Allergic/Immunologic: Negative.   Neurological: Negative.   Hematological: Negative.   Psychiatric/Behavioral: Negative.    All other systems reviewed and are negative.    Objective:    Physical Exam Vitals reviewed.  Constitutional:      Appearance: Normal appearance.  HENT:     Mouth/Throat:     Mouth: Mucous membranes are moist.  Eyes:     Pupils: Pupils are equal, round, and reactive to light.  Neck:     Vascular: No carotid bruit.  Cardiovascular:     Rate and Rhythm: Normal rate and regular rhythm.     Pulses: Normal pulses.     Heart sounds: Normal heart sounds.  Pulmonary:     Effort: Pulmonary effort is normal.     Breath sounds: Normal breath sounds.  Abdominal:     General: Bowel sounds are normal.     Palpations: Abdomen is soft. There is no hepatomegaly, splenomegaly or mass.     Tenderness: There is no abdominal tenderness.     Hernia: No hernia is present.  Genitourinary:    Comments: Prostate is normal. Musculoskeletal:     Cervical back: Neck supple.     Right lower leg: No edema.     Left lower leg: No edema.  Skin:    Findings: No rash.  Neurological:     Mental Status: He is alert and oriented to person, place, and time.     Motor: No weakness.  Psychiatric:        Mood and Affect: Mood normal.        Behavior: Behavior normal.    BP (!) 153/84   Pulse 96   Ht 6' (1.829 m)   Wt 199 lb 14.4 oz (90.7 kg)   BMI 27.11 kg/m  Wt Readings from Last 3 Encounters:  08/10/21 199 lb 14.4 oz (90.7 kg)  12/11/18 203 lb (92.1 kg)  11/29/18 203 lb (92.1 kg)     Health Maintenance Due  Topic Date Due   COVID-19 Vaccine (1) Never done   Hepatitis C Screening  Never done   TETANUS/TDAP  Never done   Zoster Vaccines- Shingrix (1 of 2) Never done    There are  no preventive care reminders to display for this patient.  Lab Results   Component Value Date   TSH 0.98 08/04/2021   Lab Results  Component Value Date   WBC 6.1 08/04/2021   HGB 15.2 08/04/2021   HCT 45.2 08/04/2021   MCV 94.4 08/04/2021   PLT 220 08/04/2021   Lab Results  Component Value Date   NA 140 08/04/2021   K 4.3 08/04/2021   CO2 27 08/04/2021   GLUCOSE 87 08/04/2021   BUN 14 08/04/2021   CREATININE 0.94 08/04/2021   BILITOT 1.1 08/04/2021   AST 27 08/04/2021   ALT 27 08/04/2021   PROT 7.1 08/04/2021   CALCIUM 9.6 08/04/2021   ANIONGAP 4 (L) 12/11/2013   EGFR 86 08/04/2021   Lab Results  Component Value Date   CHOL 147 08/04/2021   Lab Results  Component Value Date   HDL 65 08/04/2021   Lab Results  Component Value Date   LDLCALC 68 08/04/2021   Lab Results  Component Value Date   TRIG 62 08/04/2021   Lab Results  Component Value Date   CHOLHDL 2.3 08/04/2021   Lab Results  Component Value Date   HGBA1C 5.3 08/04/2021      Assessment & Plan:   Problem List Items Addressed This Visit       Other   Annual physical exam    Patient denies any chest pain or shortness of breath heart is regular chest is clear abdomen is soft nontender without any hepatosplenomegaly.  Rectal examination revealed normal sphincter tone prostate is bilobed normal without any nodule.  There is no pedal edema no calf tenderness.  Neurological examination is nonfocal.      Need for influenza vaccination - Primary    Patient was vaccinated against flu      Relevant Orders   Flu Vaccine QUAD High Dose(Fluad) (Completed)   Dyslipidemia    Hypercholesterolemia  I advised the patient to follow Mediterranean diet This diet is rich in fruits vegetables and whole grain, and This diet is also rich in fish and lean meat Patient should also eat a handful of almonds or walnuts daily Recent heart study indicated that average follow-up on this kind of diet reduces the cardiovascular mortality by 50 to 70%==      BPH associated with nocturia     Started on Flomax again patient is nocturia 3 times at night, PSA is normal, prostate examination is normal       Meds ordered this encounter  Medications   tamsulosin (FLOMAX) 0.4 MG CAPS capsule    Sig: Take 1 capsule (0.4 mg total) by mouth daily.    Dispense:  90 capsule    Refill:  3     Follow-up: No follow-ups on file.    Cletis Athens, MD

## 2021-08-10 NOTE — Assessment & Plan Note (Signed)
Patient was vaccinated against flu

## 2021-08-24 ENCOUNTER — Telehealth: Payer: Self-pay | Admitting: Gastroenterology

## 2021-08-24 NOTE — Telephone Encounter (Signed)
Inbound call from patient wife Lelan Pons. States when patient have a bowel movement he is seeing bright red blood that have been going on for the last 2 weeks. States they were treating it for hemorrhoids but it is not getting better. Best contact number  [phone]

## 2021-08-24 NOTE — Telephone Encounter (Signed)
Pt scheduled to see Dr. Havery Moros 08/26/21@3 :20pm. Pt aware of appt and knows to go to the ER if the bleeding worsens, he becomes SOB or has any dizziness. Pt verbalized understanding.

## 2021-08-26 ENCOUNTER — Ambulatory Visit: Payer: PPO | Admitting: Gastroenterology

## 2021-08-26 ENCOUNTER — Encounter: Payer: Self-pay | Admitting: Gastroenterology

## 2021-08-26 VITALS — BP 166/68 | HR 70 | Ht 72.0 in | Wt 204.0 lb

## 2021-08-26 DIAGNOSIS — K625 Hemorrhage of anus and rectum: Secondary | ICD-10-CM | POA: Diagnosis not present

## 2021-08-26 DIAGNOSIS — K648 Other hemorrhoids: Secondary | ICD-10-CM | POA: Diagnosis not present

## 2021-08-26 MED ORDER — HYDROCORTISONE 1 % EX OINT: TOPICAL_OINTMENT | CUTANEOUS | 0 refills | Status: DC

## 2021-08-26 NOTE — Patient Instructions (Addendum)
If you are age 73 or older, your body mass index should be between 23-30. Your Body mass index is 27.67 kg/m. If this is out of the aforementioned range listed, please consider follow up with your Primary Care Provider.  If you are age 37 or younger, your body mass index should be between 19-25. Your Body mass index is 27.67 kg/m. If this is out of the aformentioned range listed, please consider follow up with your Primary Care Provider.   ________________________________________________________  The [facility] providers would like to encourage you to use [facility] to communicate with providers for non-urgent requests or questions.  Due to long hold times on the telephone, sending your provider a message by [facility] may be a faster and more efficient way to get a response.  Please allow 48 business hours for a response.  Please remember that this is for non-urgent requests.  _______________________________________________________  Please purchase the following medications over the counter and take as directed: 1% hydrocortisone cream: Use a pea sized amount in the rectum 1 to 2 times a day Calmol 4 suppositories: use as directed as needed (you can purchase these at [facility], [facility] and [facility])  Continue Metamucil  Avoid heavy lifting and let us know how you are doing in a couple of weeks.  Thank you for entrusting me with your care and for choosing [facility], Dr. Loma Cellar

## 2021-08-26 NOTE — Progress Notes (Signed)
HPI :  75 year old male here for follow-up visit for rectal bleeding.  Recall he underwent a colonoscopy with me in February 2020, had 1 diminutive adenoma removed and another small hyperplastic polyp.  He did have moderate size internal hemorrhoids on exam.  He states that 2 weeks ago he started developing some blood in his stool.  He has roughly 3 bowel movements per day at baseline, takes Metamucil routinely which keeps him regular.  The first bowel movement a day he noticed some small amount of blood on the toilet paper and then some blood in the toilet bowl.  No blood covering the stool.  Denies any rectal pain with this.  The second 2 bowel movement the day has not noticed blood.  Has been ongoing for the past 2 weeks for the past 2 days has not noticed as much.  He denies any constipation or straining but does lift heavy weight a lot.  He tried some over-the-counter [facility] suppositories for treatment of hemorrhoids which appear to be more topical emollient, no steroid use.  Prior to the past 2 weeks he had very rare scant bleeding at times but usually none at all.  Colonoscopy 12/11/18 - The perianal and digital rectal examinations were normal. - A diminutive polyp was found in the ascending colon. The polyp was sessile. The polyp was removed with a cold biopsy forceps. Resection and retrieval were complete. - A diminutive polyp was found in the recto-sigmoid colon. The polyp was sessile. The polyp was removed with a cold biopsy forceps. Resection and retrieval were complete. 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 - Internal hemorrhoids were found during retroflexion. The hemorrhoids were moderate. - The exam was otherwise without abnormality.  Surgical [P], ascending colon and recto-sigmoid, polyp (2) - TUBULAR ADENOMA. - HYPERPLASTIC POLYP. - NO HIGH GRADE DYSPLASIA OR MALIGNANCY.      Past Medical History:  Diagnosis Date   Actinic keratosis     Allergy    Arthritis    Basal cell carcinoma 08/12/2020   L post lat base of neck    BCC (basal cell carcinoma of skin) 02/17/2021   L infra auricular, exc 03/02/21   History of colon polyps    Hyperlipidemia    Hypertension    Melanoma ([place]) 2009   left top of shoulder. Dr. Koleen Nimrod   Seasonal allergies    Squamous cell carcinoma of skin 11/02/2017   left temple anterior to sideburn. [facility] 11/02/2017     Past Surgical History:  Procedure Laterality Date   COLONOSCOPY     MELANOMA EXCISION  2009   POLYPECTOMY     TOTAL KNEE ARTHROPLASTY Right 2011   TOTAL KNEE ARTHROPLASTY Left    Family History  Problem Relation Age of Onset   Colon cancer Mother        died age 58-dx'd late 7-0's   Esophageal cancer Neg Hx    Rectal cancer Neg Hx    Stomach cancer Neg Hx    Crohn's disease Neg Hx    Social History   Tobacco Use   Smoking status: Former    Types: Cigarettes    Quit date: 10/24/1972    Years since quitting: 48.8   Smokeless tobacco: Former    Quit date: 10/24/2002  Substance Use Topics   Alcohol use: Yes    Alcohol/week: 3.0 standard drinks    Types: 3 Glasses of wine per week   Drug use: No   Current  Outpatient Medications  Medication Sig Dispense Refill   amLODipine (NORVASC) 5 MG tablet TAKE 1 TABLET BY MOUTH EVERY DAY 90 tablet 3   aspirin 81 MG tablet Take 81 mg by mouth daily.     COD LIVER OIL PO Take 1 capsule by mouth daily.     Glycerin, Laxative, ([facility], ADULT, RE) Place rectally as needed.     levofloxacin (LEVAQUIN) 500 MG tablet Take 500 mg by mouth daily.     losartan (COZAAR) 100 MG tablet TAKE 1 TABLET BY MOUTH EVERY DAY 90 tablet 3   milk thistle 175 MG tablet Take 250 mg by mouth daily.     Multiple Vitamin (MULTIVITAMIN) tablet Take by mouth.     Multiple Vitamins-Minerals (CENTRUM SILVER PO) Take by mouth.     phenylephrine-shark liver oil-mineral oil-petrolatum ([facility] HEMORRHOIDAL) 0.25-14-74.9 % rectal ointment Place 1 application  rectally 2 (two) times daily as needed for hemorrhoids.     Psyllium (METAMUCIL PO) Take 2 scoop by mouth daily.     rosuvastatin (CRESTOR) 10 MG tablet TAKE 1 TABLET BY MOUTH EVERY DAY 90 tablet 2   tamsulosin (FLOMAX) 0.4 MG CAPS capsule      thiamine (VITAMIN B-1) 100 MG tablet Take 100 mg by mouth daily.     No current facility-administered medications for this visit.   No Known Allergies   Review of Systems: All systems reviewed and negative except where noted in HPI.   Lab Results  Component Value Date   WBC 6.1 08/04/2021   HGB 15.2 08/04/2021   HCT 45.2 08/04/2021   MCV 94.4 08/04/2021   PLT 220 08/04/2021     Physical Exam: BP (!) 166/68   Pulse 70   Ht 6' (1.829 m)   Wt 204 lb (92.5 kg)   BMI 27.67 kg/m  Constitutional: Pleasant,well-developed, male in no acute distress. DRE /Anoscopy - internal hemorrhoids inflamed, no fissure or mass. Dorisann Frames CMA as standby Extremities: no edema Lymphadenopathy: No cervical adenopathy noted. Neurological: Alert and oriented to person place and time. Skin: Skin is warm and dry. No rashes noted. Psychiatric: Normal mood and affect. Behavior is normal.   ASSESSMENT AND PLAN: 75 year old male here for reassessment of following:  Rectal bleeding Internal hemorrhoids  DRE today performed as well as endoscopy.  He has inflamed internal hemorrhoids which appear to be the cause of his symptoms.  No fissure.  Discussed options.  He should continue Metamucil and avoid straining.  Recommend 1% topical hydrocortisone cream, apply pea-sized amount PR once to twice daily for the next week or 2.  He can also try some Calmol 4 suppositories as needed.  He should avoid heavy lifting.  Hopefully he improves with conservative measures and does better over the upcoming few days.  If symptoms persist despite this we can consider hemorrhoid banding, hopefully that is not needed.  He can contact me as needed.  Jolly Mango, MD [facility]  Gastroenterology

## 2021-08-27 ENCOUNTER — Telehealth: Payer: Self-pay | Admitting: Gastroenterology

## 2021-08-27 NOTE — Telephone Encounter (Signed)
Patient called is having a hard time with finding the Calmol suppositories seeking advise.

## 2021-08-27 NOTE — Telephone Encounter (Signed)
Called patient.  He has found the suppositories at a local pharmacy. They are in [place].

## 2021-09-02 ENCOUNTER — Ambulatory Visit: Payer: PPO | Admitting: Dermatology

## 2021-09-02 ENCOUNTER — Other Ambulatory Visit: Payer: Self-pay

## 2021-09-02 DIAGNOSIS — L814 Other melanin hyperpigmentation: Secondary | ICD-10-CM

## 2021-09-02 DIAGNOSIS — Z1283 Encounter for screening for malignant neoplasm of skin: Secondary | ICD-10-CM | POA: Diagnosis not present

## 2021-09-02 DIAGNOSIS — Z85820 Personal history of malignant melanoma of skin: Secondary | ICD-10-CM | POA: Diagnosis not present

## 2021-09-02 DIAGNOSIS — D1800 Hemangioma unspecified site: Secondary | ICD-10-CM | POA: Diagnosis not present

## 2021-09-02 DIAGNOSIS — L570 Actinic keratosis: Secondary | ICD-10-CM | POA: Diagnosis not present

## 2021-09-02 DIAGNOSIS — Z85828 Personal history of other malignant neoplasm of skin: Secondary | ICD-10-CM

## 2021-09-02 DIAGNOSIS — L578 Other skin changes due to chronic exposure to nonionizing radiation: Secondary | ICD-10-CM

## 2021-09-02 DIAGNOSIS — D229 Melanocytic nevi, unspecified: Secondary | ICD-10-CM

## 2021-09-02 DIAGNOSIS — L821 Other seborrheic keratosis: Secondary | ICD-10-CM | POA: Diagnosis not present

## 2021-09-02 DIAGNOSIS — L820 Inflamed seborrheic keratosis: Secondary | ICD-10-CM

## 2021-09-02 NOTE — Patient Instructions (Addendum)

## 2021-09-02 NOTE — Progress Notes (Signed)
Follow-Up Visit   Subjective  [name] is a 75 y.o. male who presents for the following: Follow-up (Patient here today for upper body exam 6 month. Patient reports no new concerns. ). Patient here for upper body skin exam and skin cancer screening.  The following portions of the chart were reviewed this encounter and updated as appropriate:  Tobacco  Allergies  Meds  Problems  Med Hx  Surg Hx  Fam Hx     Review of Systems: No other skin or systemic complaints except as noted in HPI or Assessment and Plan.  Objective  Well appearing patient in no apparent distress; mood and affect are within normal limits.  A focused examination was performed including upper extremities, including the arms, hands, fingers, and fingernails. Relevant physical exam findings are noted in the Assessment and Plan.  Right Forehead x 1, left forehead x 1 (10) Erythematous thin papules/macules with gritty scale.   left shoulder x 3, left sideburn x 2 (5) Erythematous keratotic or waxy stuck-on papule or plaque.   Assessment & Plan  Actinic keratosis (10) Right Forehead x 1, left forehead x 1  Actinic keratoses are precancerous spots that appear secondary to cumulative UV radiation exposure/sun exposure over time. They are chronic with expected duration over 1 year. A portion of actinic keratoses will progress to squamous cell carcinoma of the skin. It is not possible to reliably predict which spots will progress to skin cancer and so treatment is recommended to prevent development of skin cancer.  Recommend daily broad spectrum sunscreen SPF 30+ to sun-exposed areas, reapply every 2 hours as needed.  Recommend staying in the shade or wearing long sleeves, sun glasses (UVA+UVB protection) and wide brim hats (4-inch brim around the entire circumference of the hat). Call for new or changing lesions.  Destruction of lesion - Right Forehead x 1, left forehead x 1 Complexity: simple   Destruction  method: cryotherapy   Informed consent: discussed and consent obtained   Timeout:  patient name, date of birth, surgical site, and procedure verified Lesion destroyed using liquid nitrogen: Yes   Region frozen until ice ball extended beyond lesion: Yes   Outcome: patient tolerated procedure well with no complications   Post-procedure details: wound care instructions given    Inflamed seborrheic keratosis left shoulder x 3, left sideburn x 2  Destruction of lesion - left shoulder x 3, left sideburn x 2 Complexity: simple   Destruction method: cryotherapy   Informed consent: discussed and consent obtained   Timeout:  patient name, date of birth, surgical site, and procedure verified Lesion destroyed using liquid nitrogen: Yes   Region frozen until ice ball extended beyond lesion: Yes   Outcome: patient tolerated procedure well with no complications   Post-procedure details: wound care instructions given   Additional details:  Prior to procedure, discussed risks of blister formation, small wound, skin dyspigmentation, or rare scar following cryotherapy. Recommend Vaseline ointment to treated areas while healing.  Lentigines - Scattered tan macules - Due to sun exposure - Benign-appearing, observe - Recommend daily broad spectrum sunscreen SPF 30+ to sun-exposed areas, reapply every 2 hours as needed. - Call for any changes  Seborrheic Keratoses - Stuck-on, waxy, tan-brown papules and/or plaques  - Benign-appearing - Discussed benign etiology and prognosis. - Observe - Call for any changes  Melanocytic Nevi - Tan-brown and/or pink-flesh-colored symmetric macules and papules - Benign appearing on exam today - Observation - Call clinic for new or changing moles -  Recommend daily use of broad spectrum spf 30+ sunscreen to sun-exposed areas.   Hemangiomas - Red papules - Discussed benign nature - Observe - Call for any changes  Actinic Damage - Chronic condition, secondary  to cumulative UV/sun exposure - diffuse scaly erythematous macules with underlying dyspigmentation - Recommend daily broad spectrum sunscreen SPF 30+ to sun-exposed areas, reapply every 2 hours as needed.  - Staying in the shade or wearing long sleeves, sun glasses (UVA+UVB protection) and wide brim hats (4-inch brim around the entire circumference of the hat) are also recommended for sun protection.  - Call for new or changing lesions.  History of Melanoma - No evidence of recurrence today left top of shoulder 2009 - No lymphadenopathy - Recommend regular full body skin exams - Recommend daily broad spectrum sunscreen SPF 30+ to sun-exposed areas, reapply every 2 hours as needed.  - Call if any new or changing lesions are noted between office visits  History of Basal Cell Carcinoma of the Skin - No evidence of recurrence today Left infra auricular excision 2022 - Recommend regular full body skin exams - Recommend daily broad spectrum sunscreen SPF 30+ to sun-exposed areas, reapply every 2 hours as needed.  - Call if any new or changing lesions are noted between office visits  History of Squamous Cell Carcinoma of the Skin - No evidence of recurrence today  2019 left temple anterior sideburn ED&C  - No lymphadenopathy - Recommend regular full body skin exams - Recommend daily broad spectrum sunscreen SPF 30+ to sun-exposed areas, reapply every 2 hours as needed.  - Call if any new or changing lesions are noted between office visits  Skin cancer screening performed today.  Return in about 6 months (around 03/02/2022) for upper body exam history of skin cancer. IRuthell Rummage, CMA, am acting as scribe for Sarina Ser, MD. Documentation: I have reviewed the above documentation for accuracy and completeness, and I agree with the above.  Sarina Ser, MD

## 2021-09-05 IMAGING — US US RENAL
1 series · 14 of 25 positions shown · non-contrast
Comparison: None.

CLINICAL DATA: Prostate pain.

EXAM:
RENAL / URINARY TRACT ULTRASOUND COMPLETE

[Series 1: us renal · 14 of 49 slices shown]
[im 1/49]
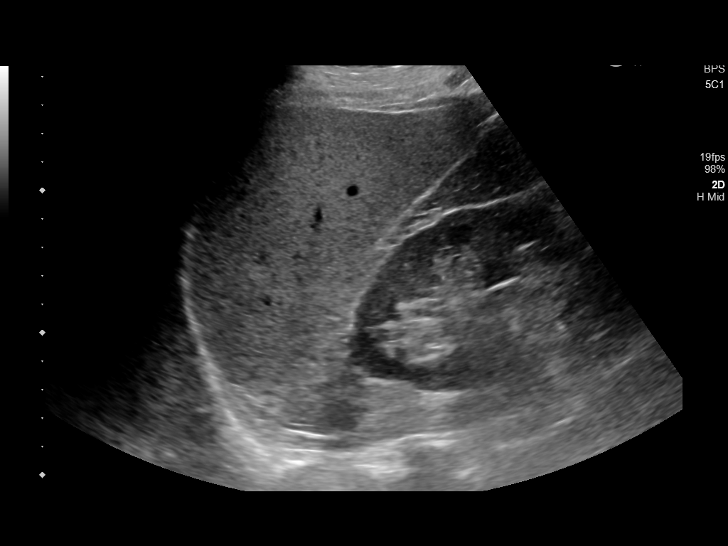
[im 5/49]
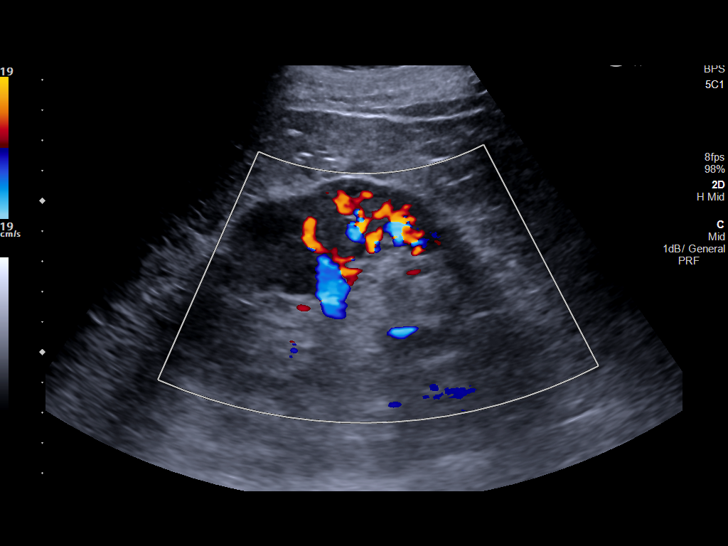
[im 9/49]
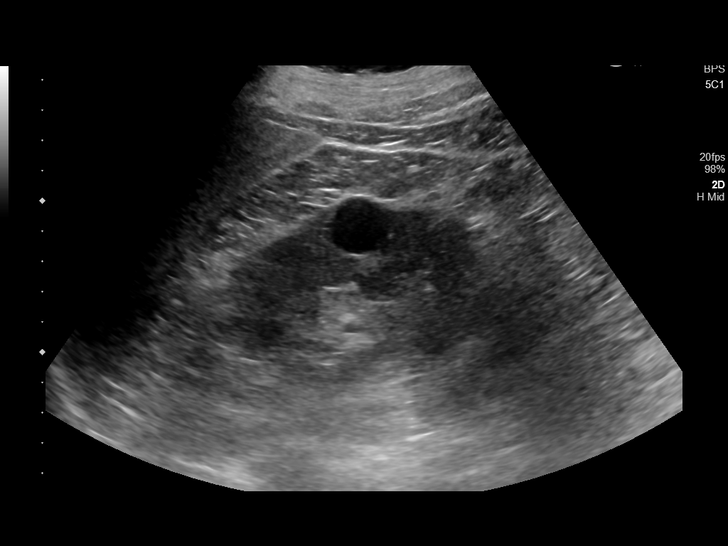
[im 13/49]
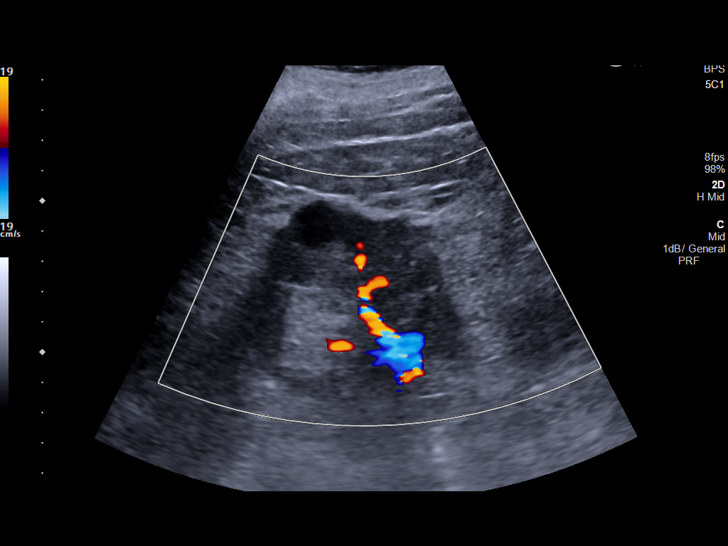
[im 17/49]
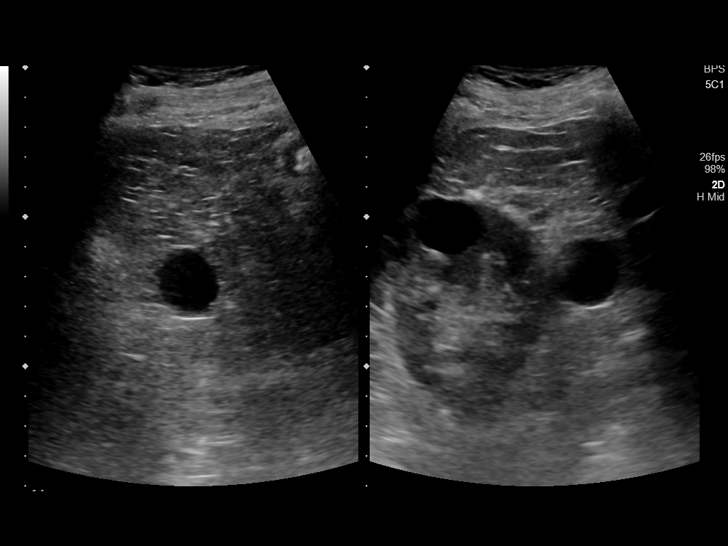
[im 19/49]
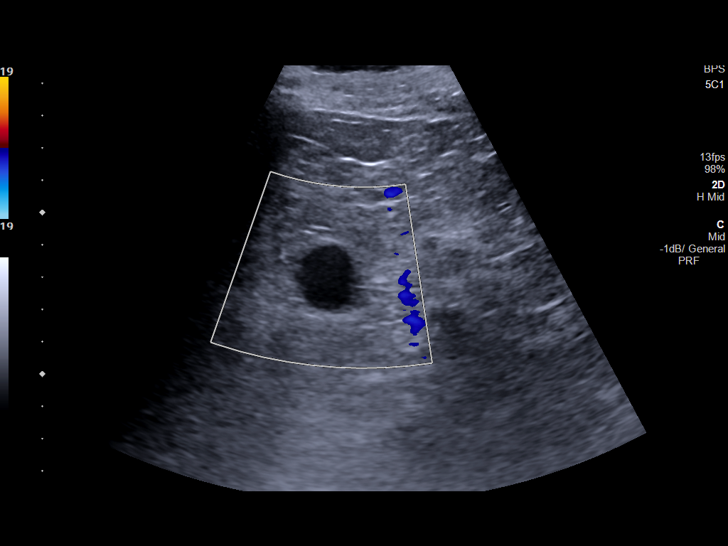
[im 23/49]
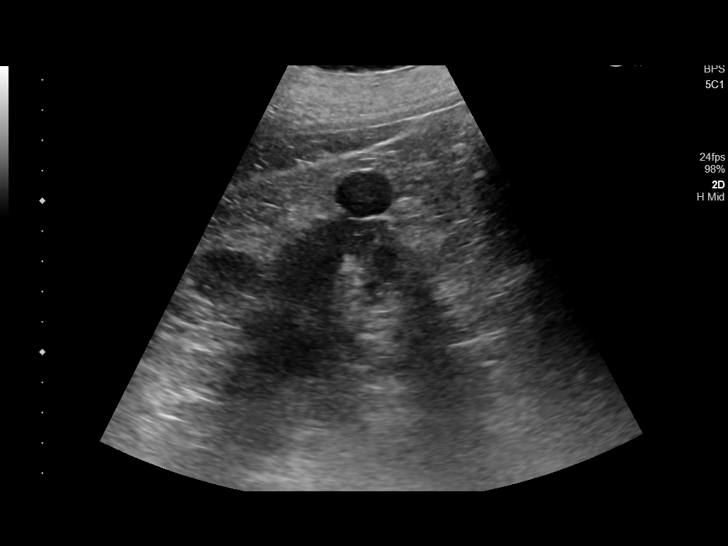
[im 27/49]
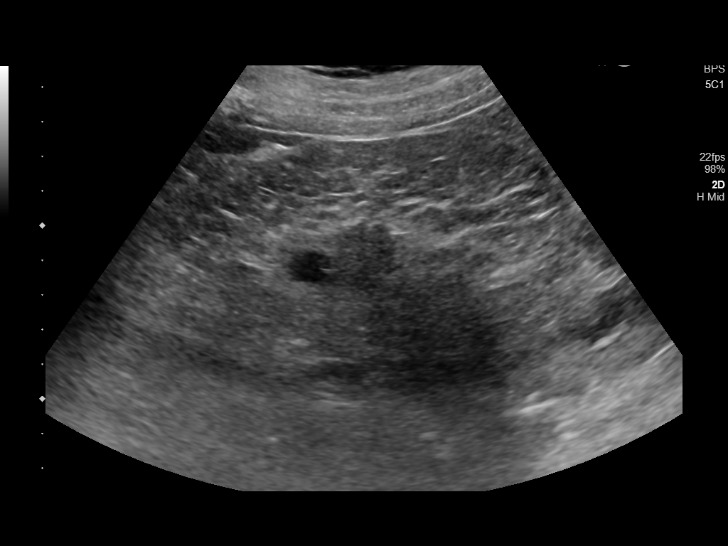
[im 31/49]
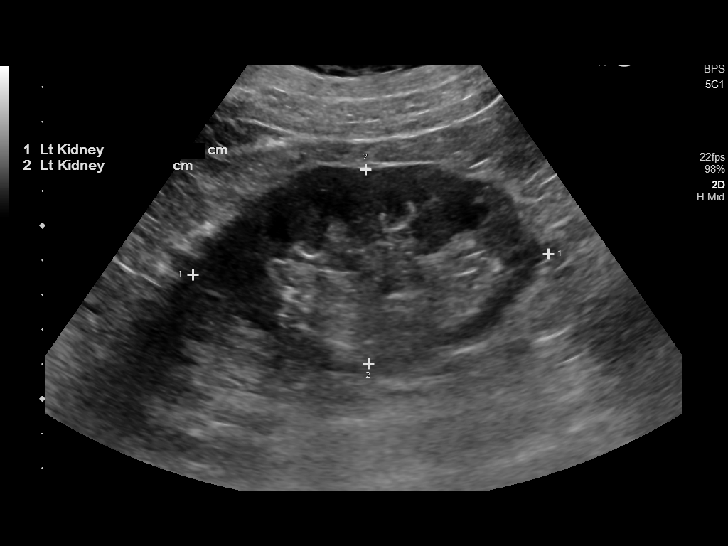
[im 33/49]
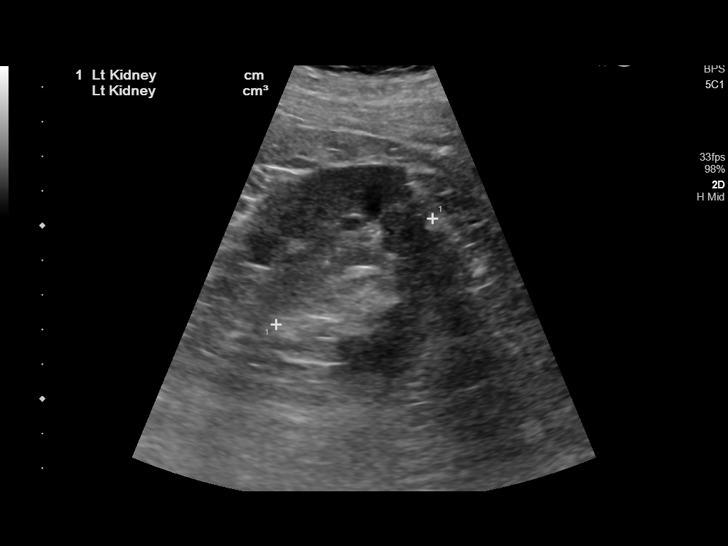
[im 37/49]
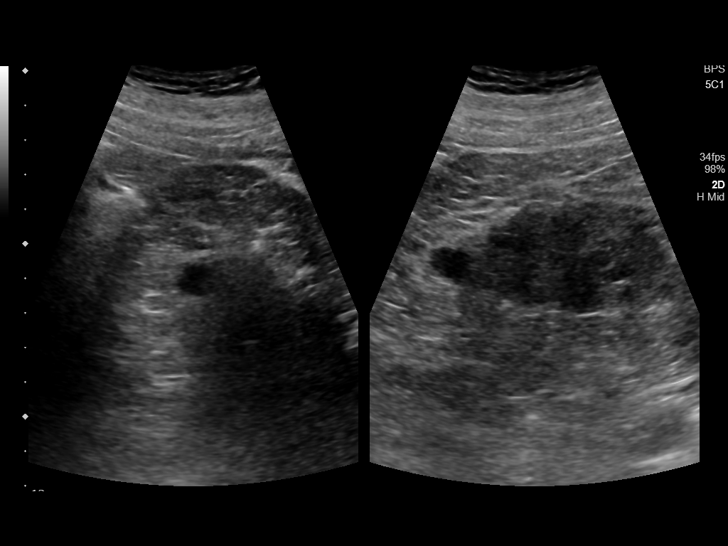
[im 41/49]
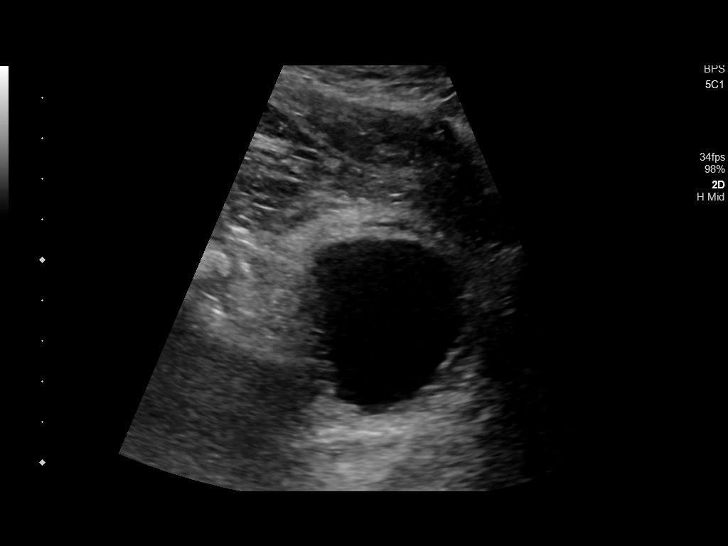
[im 45/49]
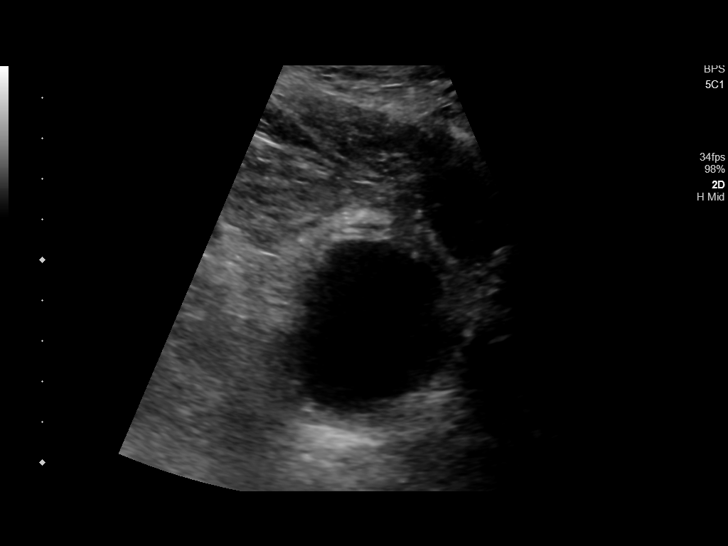
[im 49/49]
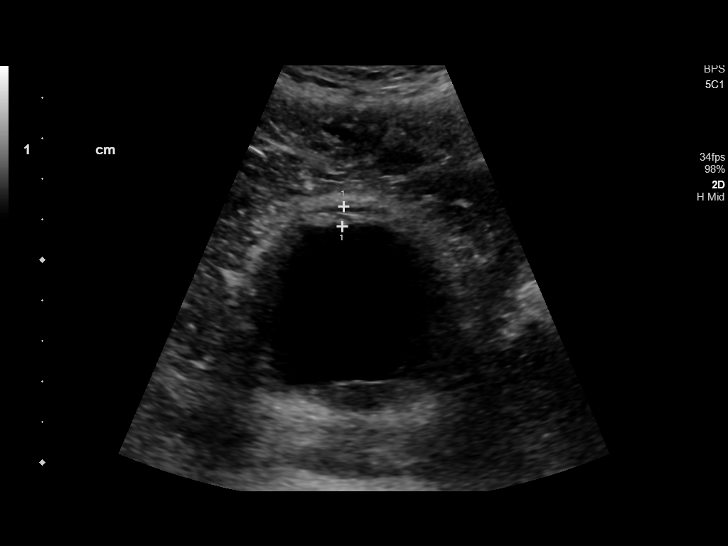

[14 of 25 positions shown; findings below may reference images not displayed]

FINDINGS: Right Kidney:

Renal measurements: 12.0 x 6.4 x 5.7 cm = volume: 228 mL. Contains 2
cysts with the largest measuring 2.2 cm.

Left Kidney:

Renal measurements: 10.3 x 5.6 x 5.5 cm = volume: 164.3 mL. Contains
a cyst measuring up to 1.2 cm.

Bladder:

The bladder wall appears somewhat thickened. The bladder is not well
distended.

Other:

None.
IMPRESSION: 1. Renal cysts.
2. The bladder wall is prominent/mildly thickened. This could be due
to poor distention but is nonspecific.

## 2021-09-07 ENCOUNTER — Encounter: Payer: Self-pay | Admitting: Dermatology

## 2021-09-08 ENCOUNTER — Ambulatory Visit: Payer: PPO | Admitting: Gastroenterology

## 2021-09-14 ENCOUNTER — Other Ambulatory Visit: Payer: Self-pay | Admitting: Internal Medicine

## 2021-10-11 ENCOUNTER — Other Ambulatory Visit: Payer: Self-pay | Admitting: *Deleted

## 2021-10-11 MED ORDER — ROSUVASTATIN CALCIUM 10 MG PO TABS: 10.0000 mg | ORAL_TABLET | Freq: Every day | ORAL | 2 refills | Status: DC

## 2022-03-10 ENCOUNTER — Other Ambulatory Visit: Payer: Self-pay | Admitting: Internal Medicine

## 2022-03-22 ENCOUNTER — Ambulatory Visit: Payer: PPO | Admitting: Dermatology

## 2022-03-24 ENCOUNTER — Ambulatory Visit: Payer: PPO | Admitting: Dermatology

## 2022-05-23 ENCOUNTER — Ambulatory Visit: Payer: PPO | Admitting: Dermatology

## 2022-05-23 ENCOUNTER — Encounter: Payer: Self-pay | Admitting: Dermatology

## 2022-05-23 DIAGNOSIS — Z85820 Personal history of malignant melanoma of skin: Secondary | ICD-10-CM

## 2022-05-23 DIAGNOSIS — L814 Other melanin hyperpigmentation: Secondary | ICD-10-CM

## 2022-05-23 DIAGNOSIS — Z85828 Personal history of other malignant neoplasm of skin: Secondary | ICD-10-CM | POA: Diagnosis not present

## 2022-05-23 DIAGNOSIS — L821 Other seborrheic keratosis: Secondary | ICD-10-CM

## 2022-05-23 DIAGNOSIS — Z1283 Encounter for screening for malignant neoplasm of skin: Secondary | ICD-10-CM | POA: Diagnosis not present

## 2022-05-23 DIAGNOSIS — C4362 Malignant melanoma of left upper limb, including shoulder: Secondary | ICD-10-CM

## 2022-05-23 DIAGNOSIS — D1800 Hemangioma unspecified site: Secondary | ICD-10-CM | POA: Diagnosis not present

## 2022-05-23 DIAGNOSIS — D229 Melanocytic nevi, unspecified: Secondary | ICD-10-CM | POA: Diagnosis not present

## 2022-05-23 DIAGNOSIS — L570 Actinic keratosis: Secondary | ICD-10-CM

## 2022-05-23 DIAGNOSIS — L578 Other skin changes due to chronic exposure to nonionizing radiation: Secondary | ICD-10-CM

## 2022-05-23 DIAGNOSIS — L820 Inflamed seborrheic keratosis: Secondary | ICD-10-CM | POA: Diagnosis not present

## 2022-05-23 NOTE — Patient Instructions (Addendum)
Seborrheic Keratosis  What causes seborrheic keratoses? Seborrheic keratoses are harmless, common skin growths that first appear during adult life.  As time goes by, more growths appear.  Some people may develop a large number of them.  Seborrheic keratoses appear on both covered and uncovered body parts.  They are not caused by sunlight.  The tendency to develop seborrheic keratoses can be inherited.  They vary in color from skin-colored to gray, brown, or even black.  They can be either smooth or have a rough, warty surface.   Seborrheic keratoses are superficial and look as if they were stuck on the skin.  Under the microscope this type of keratosis looks like layers upon layers of skin.  That is why at times the top layer may seem to fall off, but the rest of the growth remains and re-grows.    Treatment Seborrheic keratoses do not need to be treated, but can easily be removed in the office.  Seborrheic keratoses often cause symptoms when they rub on clothing or jewelry.  Lesions can be in the way of shaving.  If they become inflamed, they can cause itching, soreness, or burning.  Removal of a seborrheic keratosis can be accomplished by freezing, burning, or surgery. If any spot bleeds, scabs, or grows rapidly, please return to have it checked, as these can be an indication of a skin cancer.  Cryotherapy Aftercare  Wash gently with soap and water everyday.   Apply Vaseline and Band-Aid daily until healed. Cryotherapy Aftercare  Wash gently with soap and water everyday.   Apply Vaseline and Band-Aid daily until healed.      Melanoma ABCDEs  Melanoma is the most dangerous type of skin cancer, and is the leading cause of death from skin disease.  You are more likely to develop melanoma if you: Have light-colored skin, light-colored eyes, or red or blond hair Spend a lot of time in the sun Tan regularly, either outdoors or in a tanning bed Have had blistering sunburns, especially during  childhood Have a close family member who has had a melanoma Have atypical moles or large birthmarks  Early detection of melanoma is key since treatment is typically straightforward and cure rates are extremely high if we catch it early.   The first sign of melanoma is often a change in a mole or a new dark spot.  The ABCDE system is a way of remembering the signs of melanoma.  A for asymmetry:  The two halves do not match. B for border:  The edges of the growth are irregular. C for color:  A mixture of colors are present instead of an even brown color. D for diameter:  Melanomas are usually (but not always) greater than 51m - the size of a pencil eraser. E for evolution:  The spot keeps changing in size, shape, and color.  Please check your skin once per month between visits. You can use a small mirror in front and a large mirror behind you to keep an eye on the back side or your body.   If you see any new or changing lesions before your next follow-up, please call to schedule a visit.  Please continue daily skin protection including broad spectrum sunscreen SPF 30+ to sun-exposed areas, reapplying every 2 hours as needed when you're outdoors.   Staying in the shade or wearing long sleeves, sun glasses (UVA+UVB protection) and wide brim hats (4-inch brim around the entire circumference of the hat) are also recommended for  sun protection.    Due to recent changes in healthcare laws, you may see results of your pathology and/or laboratory studies on MyChart before the doctors have had a chance to review them. We understand that in some cases there may be results that are confusing or concerning to you. Please understand that not all results are received at the same time and often the doctors may need to interpret multiple results in order to provide you with the best plan of care or course of treatment. Therefore, we ask that you please give [place] 2 business days to thoroughly review all your  results before contacting the office for clarification. Should we see a critical lab result, you will be contacted sooner.   If You Need Anything After Your Visit  If you have any questions or concerns for your doctor, please call our main line at [phone] and press option 4 to reach your doctor's medical assistant. If no one answers, please leave a voicemail as directed and we will return your call as soon as possible. Messages left after 4 pm will be answered the following business day.   You may also send [place] a message via [place]. We typically respond to MyChart messages within 1-2 business days.  For prescription refills, please ask your pharmacy to contact our office. Our fax number is [phone].  If you have an urgent issue when the clinic is closed that cannot wait until the next business day, you can page your doctor at the number below.    Please note that while we do our best to be available for urgent issues outside of office hours, we are not available 24/7.   If you have an urgent issue and are unable to reach [place], you may choose to seek medical care at your doctor's office, retail clinic, urgent care center, or emergency room.  If you have a medical emergency, please immediately call 911 or go to the emergency department.  Pager Numbers  - Dr. Nehemiah Massed: [phone]  - Dr. Laurence Ferrari: [phone]  - Dr. Nicole Kindred: [phone]  In the event of inclement weather, please call our main line at [phone] for an update on the status of any delays or closures.  Dermatology Medication Tips: Please keep the boxes that topical medications come in in order to help keep track of the instructions about where and how to use these. Pharmacies typically print the medication instructions only on the boxes and not directly on the medication tubes.   If your medication is too expensive, please contact our office at [phone] option 4 or send [place] a message through [place].   We are  unable to tell what your co-pay for medications will be in advance as this is different depending on your insurance coverage. However, we may be able to find a substitute medication at lower cost or fill out paperwork to get insurance to cover a needed medication.   If a prior authorization is required to get your medication covered by your insurance company, please allow [place] 1-2 business days to complete this process.  Drug prices often vary depending on where the prescription is filled and some pharmacies may offer cheaper prices.  The website www.goodrx.com contains coupons for medications through different pharmacies. The prices here do not account for what the cost may be with help from insurance (it may be cheaper with your insurance), but the website can give you the price if you did not use any insurance.  - You can print the  associated coupon and take it with your prescription to the pharmacy.  - You may also stop by our office during regular business hours and pick up a GoodRx coupon card.  - If you need your prescription sent electronically to a different pharmacy, notify our office through [place] or by phone at [phone] option 4.     Si Usted Necesita Algo Despus de Su Visita  Tambin puede enviarnos un mensaje a travs de Pharmacist, community. Por lo general respondemos a los mensajes de MyChart en el transcurso de 1 a 2 das hbiles.  Para renovar recetas, por favor pida a su farmacia que se ponga en contacto con nuestra oficina. Harland Dingwall de fax es [place] [phone].  Si tiene un asunto urgente cuando la clnica est cerrada y que no puede esperar hasta el siguiente da hbil, puede llamar/localizar a su doctor(a) al nmero que aparece a continuacin.   Por favor, tenga en cuenta que aunque hacemos todo lo posible para estar disponibles para asuntos urgentes fuera del horario de [place], no estamos disponibles las 24 horas del da, los 7 das de la [place].   Si tiene un  problema urgente y no puede comunicarse con nosotros, puede optar por buscar atencin mdica  en el consultorio de su doctor(a), en una clnica privada, en un centro de atencin urgente o en una sala de emergencias.  Si tiene Engineering geologist, por favor llame inmediatamente al 911 o vaya a la sala de emergencias.  Nmeros de bper  - Dr. Nehemiah Massed: [phone]  - Dra. Moye: [phone]  - Dra. Nicole Kindred: [phone]  En caso de inclemencias del [place], por favor llame a Johnsie Kindred principal al [phone] para una actualizacin sobre el [place] de cualquier retraso o cierre.  Consejos para la medicacin en dermatologa: Por favor, guarde las cajas en las que vienen los medicamentos de uso tpico para ayudarle a seguir las instrucciones sobre dnde y cmo usarlos. Las farmacias generalmente imprimen las instrucciones del medicamento slo en las cajas y no directamente en los tubos del [place].   Si su medicamento es muy caro, por favor, pngase en contacto con Zigmund Daniel llamando al [phone] y presione la opcin 4 o envenos un mensaje a travs de Pharmacist, community.   No podemos decirle cul ser su copago por los medicamentos por adelantado ya que esto es diferente dependiendo de la cobertura de su seguro. Sin embargo, es posible que podamos encontrar un medicamento sustituto a Electrical engineer un formulario para que el seguro cubra el medicamento que se considera necesario.   Si se requiere una autorizacin previa para que su compaa de seguros [place] su medicamento, por favor permtanos de 1 a 2 das hbiles para completar este proceso.  Los precios de los medicamentos varan con frecuencia dependiendo del Environmental consultant de dnde se surte la receta y alguna farmacias pueden ofrecer precios ms baratos.  El sitio web www.goodrx.com tiene cupones para medicamentos de Airline pilot. Los precios aqu no tienen en cuenta lo que podra costar con la ayuda del seguro (puede ser ms  barato con su seguro), pero el sitio web puede darle el precio si no utiliz Research scientist (physical sciences).  - Puede imprimir el cupn correspondiente y llevarlo con su receta a la farmacia.  - Tambin puede pasar por nuestra oficina durante el horario de atencin regular y Charity fundraiser una tarjeta de cupones de GoodRx.  - Si necesita que su receta se enve electrnicamente a Chiropodist, informe a nuestra oficina a  Lawerance Cruel de MyChart de [place] o por telfono llamando al [phone] y presione la opcin 4.

## 2022-05-23 NOTE — Progress Notes (Signed)
Follow-Up Visit   Subjective  [name] is a 76 y.o. male who presents for the following: Annual Exam (6 month ubse, hx of aks, isks, hx of melanoma. Reports a spot at right shoulder and some spots at face he would like checked.) The patient presents for Upper Body Skin Exam (UBSE) for skin cancer screening and mole check.  The patient has spots, moles and lesions to be evaluated, some may be new or changing and the patient has concerns that these could be cancer.  The following portions of the chart were reviewed this encounter and updated as appropriate:  Tobacco  Allergies  Meds  Problems  Med Hx  Surg Hx  Fam Hx     Review of Systems: No other skin or systemic complaints except as noted in HPI or Assessment and Plan.  Objective  Well appearing patient in no apparent distress; mood and affect are within normal limits.  A full examination was performed including scalp, head, eyes, ears, nose, lips, neck, chest, axillae, abdomen, back, buttocks, bilateral upper extremities, bilateral lower extremities, hands, feet, fingers, toes, fingernails, and toenails. All findings within normal limits unless otherwise noted below.  scalp , face, and arms x 9 (9) Erythematous stuck-on, waxy papule or plaque  right lateral neck 0.7 cm waxy papule   face x10 (10) Erythematous thin papules/macules with gritty scale.    Assessment & Plan  Inflamed seborrheic keratosis (10) right lateral neck; scalp , face, and arms x 9 (9) Symptomatic, irritating, patient would like treated. Epidermal / dermal shaving - right lateral neck Lesion diameter (cm):  0.7 Informed consent: discussed and consent obtained   Timeout: patient name, date of birth, surgical site, and procedure verified   Procedure prep:  Patient was prepped and draped in usual sterile fashion Prep type:  Isopropyl alcohol Anesthesia: the lesion was anesthetized in a standard fashion   Anesthetic:  1% lidocaine w/ epinephrine  1-100,000 buffered w/ 8.4% NaHCO3 Instrument used: flexible razor blade   Hemostasis achieved with: pressure, aluminum chloride and electrodesiccation   Outcome: patient tolerated procedure well   Post-procedure details: sterile dressing applied and wound care instructions given   Dressing type: bandage and petrolatum   Additional details:    Destruction of lesion - scalp , face, and arms x 9 Complexity: simple   Destruction method: cryotherapy   Informed consent: discussed and consent obtained   Timeout:  patient name, date of birth, surgical site, and procedure verified Lesion destroyed using liquid nitrogen: Yes   Region frozen until ice ball extended beyond lesion: Yes   Outcome: patient tolerated procedure well with no complications   Post-procedure details: wound care instructions given   Additional details:  Prior to procedure, discussed risks of blister formation, small wound, skin dyspigmentation, or rare scar following cryotherapy. Recommend Vaseline ointment to treated areas while healing.  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 They are chronic with expected duration over 1 year. A portion of actinic keratoses will progress to squamous cell carcinoma of the skin. It is not possible to reliably predict which spots will progress to skin cancer and so treatment is recommended to prevent development of skin cancer.  Recommend daily broad spectrum sunscreen SPF 30+ to sun-exposed areas, reapply every 2 hours as needed.  Recommend staying in the shade or wearing long sleeves, sun glasses (UVA+UVB protection) and wide brim hats (4-inch brim around the  entire circumference of the hat). Call for new or changing lesions.  Destruction of lesion - face x10 Complexity: simple   Destruction method: cryotherapy   Informed consent: discussed and consent obtained   Timeout:   patient name, date of birth, surgical site, and procedure verified Lesion destroyed using liquid nitrogen: Yes   Region frozen until ice ball extended beyond lesion: Yes   Outcome: patient tolerated procedure well with no complications   Post-procedure details: wound care instructions given   Additional details:  Prior to procedure, discussed risks of blister formation, small wound, skin dyspigmentation, or rare scar following cryotherapy. Recommend Vaseline ointment to treated areas while healing.  Lentigines - Scattered tan macules - Due to sun exposure - Benign-appearing, observe - Recommend daily broad spectrum sunscreen SPF 30+ to sun-exposed areas, reapply every 2 hours as needed. - Call for any changes  Seborrheic Keratoses - Stuck-on, waxy, tan-brown papules and/or plaques  - Benign-appearing - Discussed benign etiology and prognosis. - Observe - Call for any changes  Melanocytic Nevi - Tan-brown and/or pink-flesh-colored symmetric macules and papules - Benign appearing on exam today - Observation - Call clinic for new or changing moles - Recommend daily use of broad spectrum spf 30+ sunscreen to sun-exposed areas.   Hemangiomas - Red papules - Discussed benign nature - Observe - Call for any changes  Actinic Damage - Chronic condition, secondary to cumulative UV/sun exposure - diffuse scaly erythematous macules with underlying dyspigmentation - Recommend daily broad spectrum sunscreen SPF 30+ to sun-exposed areas, reapply every 2 hours as needed.  - Staying in the shade or wearing long sleeves, sun glasses (UVA+UVB protection) and wide brim hats (4-inch brim around the entire circumference of the hat) are also recommended for sun protection.  - Call for new or changing lesions.  History of Basal Cell Carcinoma of the Skin 2021 left post lat base of neck , left infra auricular excision 02/2021 - No evidence of recurrence today - Recommend regular full body skin  exams - Recommend daily broad spectrum sunscreen SPF 30+ to sun-exposed areas, reapply every 2 hours as needed.  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  - Call if any new or changing lesions are noted between office visits  History of Melanoma Left top of shoulder 2009 - No evidence of recurrence today - No lymphadenopathy - Recommend regular full body skin exams - Recommend daily broad spectrum sunscreen SPF 30+ to sun-exposed areas, reapply every 2 hours as needed.  - Call if any new or changing lesions are noted between office visits  Skin cancer screening performed today. Return in about 6 months (around 11/23/2022) for upper body exam . I, Ruthell Rummage, CMA, am acting as scribe for Sarina Ser, MD. Documentation: I have reviewed the above documentation for accuracy and completeness, and I agree with the above.  Sarina Ser, MD

## 2022-07-03 ENCOUNTER — Other Ambulatory Visit: Payer: Self-pay | Admitting: Internal Medicine

## 2022-07-14 ENCOUNTER — Other Ambulatory Visit: Payer: Self-pay | Admitting: Internal Medicine

## 2022-07-24 HISTORY — PX: OTHER SURGICAL HISTORY: SHX169

## 2022-08-16 ENCOUNTER — Emergency Department
Admission: EM | Admit: 2022-08-16 | Discharge: 2022-08-16 | Disposition: A | Discharge status: Home / Self Care | Payer: PPO | Attending: Emergency Medicine | Admitting: Emergency Medicine

## 2022-08-16 ENCOUNTER — Encounter: Payer: Self-pay | Admitting: Emergency Medicine

## 2022-08-16 ENCOUNTER — Other Ambulatory Visit: Payer: Self-pay

## 2022-08-16 DIAGNOSIS — R002 Palpitations: Secondary | ICD-10-CM | POA: Diagnosis present

## 2022-08-16 DIAGNOSIS — I10 Essential (primary) hypertension: Secondary | ICD-10-CM | POA: Diagnosis not present

## 2022-08-16 DIAGNOSIS — I493 Ventricular premature depolarization: Secondary | ICD-10-CM | POA: Insufficient documentation

## 2022-08-16 LAB — COMPREHENSIVE METABOLIC PANEL WITH GFR
ALT: 29 U/L (ref 0–44)
AST: 28 U/L (ref 15–41)
Albumin: 4.1 g/dL (ref 3.5–5.0)
Alkaline Phosphatase: 50 U/L (ref 38–126)
Anion gap: 8 (ref 5–15)
BUN: 15 mg/dL (ref 8–23)
CO2: 26 mmol/L (ref 22–32)
Calcium: 9.4 mg/dL (ref 8.9–10.3)
Chloride: 104 mmol/L (ref 98–111)
Creatinine, Ser: 1.07 mg/dL (ref 0.61–1.24)
GFR, Estimated: >60 mL/min
Glucose, Bld: 103 mg/dL — ABNORMAL HIGH (ref 70–99)
Potassium: 3.7 mmol/L (ref 3.5–5.1)
Sodium: 138 mmol/L (ref 135–145)
Total Bilirubin: 1 mg/dL (ref 0.3–1.2)
Total Protein: 7.5 g/dL (ref 6.5–8.1)

## 2022-08-16 LAB — CBC WITH DIFFERENTIAL/PLATELET
Abs Immature Granulocytes: 0.02 10*3/uL (ref 0.00–0.07)
Basophils Absolute: 0 10*3/uL (ref 0.0–0.1)
Basophils Relative: 1 %
Eosinophils Absolute: 0.2 10*3/uL (ref 0.0–0.5)
Eosinophils Relative: 3 %
HCT: 44.6 % (ref 39.0–52.0)
Hemoglobin: 14.4 g/dL (ref 13.0–17.0)
Immature Granulocytes: 0 %
Lymphocytes Relative: 42 %
Lymphs Abs: 3.1 10*3/uL (ref 0.7–4.0)
MCH: 30.3 pg (ref 26.0–34.0)
MCHC: 32.3 g/dL (ref 30.0–36.0)
MCV: 93.9 fL (ref 80.0–100.0)
Monocytes Absolute: 0.5 10*3/uL (ref 0.1–1.0)
Monocytes Relative: 7 %
Neutro Abs: 3.6 10*3/uL (ref 1.7–7.7)
Neutrophils Relative %: 47 %
Platelets: 247 10*3/uL (ref 150–400)
RBC: 4.75 MIL/uL (ref 4.22–5.81)
RDW: 13.8 % (ref 11.5–15.5)
WBC: 7.4 10*3/uL (ref 4.0–10.5)
nRBC: 0 % (ref 0.0–0.2)

## 2022-08-16 LAB — TROPONIN I (HIGH SENSITIVITY): Troponin I (High Sensitivity): 3 ng/L

## 2022-08-16 MED ORDER — METOPROLOL TARTRATE 25 MG PO TABS: 12.5000 mg | ORAL_TABLET | Freq: Two times a day (BID) | ORAL | 1 refills | Status: DC

## 2022-08-16 NOTE — ED Provider Notes (Signed)
[facility] Provider Note    Event Date/Time   First MD Initiated Contact with Patient 08/16/22 1257     (approximate)   History   Palpitations   HPI  [name] is a 76 y.o. male   to the ED with complaint of heart palpitations that started approximately 3 weeks ago but worse today.  Patient states that he was up on a ladder sawing limbs on a tree when he noticed that his heart was skipping a beat.  He denies any shortness of breath, dizziness, headache, chest pain, diaphoresis, nausea or vomiting.  He denies any previous cardiac history.  Family history is positive for heart disease.  Patient states he had a stress test done at Dr. Paticia Stack office approximately 3 years ago which was normal.  He consumes 2 cups of coffee per day and occasionally drinks beer or wine and in the evenings.  Currently he denies any pain.  Patient has a history of hypertension, allergies and hyperlipidemia.      Physical Exam   Triage Vital Signs: ED Triage Vitals  Enc Vitals Group     BP 08/16/22 1235 (!) 164/84     Pulse Rate 08/16/22 1235 95     Resp 08/16/22 1235 17     Temp 08/16/22 1235 98.2 F (36.8 C)     Temp Source 08/16/22 1235 Oral     SpO2 08/16/22 1235 98 %     Weight 08/16/22 1230 205 lb (93 kg)     Height 08/16/22 1230 6' (1.829 m)     Head Circumference --      Peak Flow --      Pain Score 08/16/22 1230 0     Pain Loc --      Pain Edu? --      Excl. in [place]? --     Most recent vital signs: Vitals:   08/16/22 1235  BP: (!) 164/84  Pulse: 95  Resp: 17  Temp: 98.2 F (36.8 C)  SpO2: 98%     General: Awake, no distress.  CV:  Good peripheral perfusion.  Heart regular rate and rhythm at this time. Resp:  Normal effort.  Lungs clear bilaterally. Abd:  No distention.  Other:     ED Results / Procedures / Treatments   Labs (all labs ordered are listed, but only abnormal results are displayed) Labs Reviewed  COMPREHENSIVE METABOLIC PANEL  - Abnormal; Notable for the following components:      Result Value   Glucose, Bld 103 (*)    All other components within normal limits  CBC WITH DIFFERENTIAL/PLATELET  TROPONIN I (HIGH SENSITIVITY)     EKG  Vent. rate 97 BPM PR interval 168 ms QRS duration 78 ms QT/QTcB 326/414 ms P-R-T axes 63 -54 59 Sinus rhythm with occasional Premature ventricular complexes Left axis deviation Abnormal ECG Premature ventricular complexes are now Present    PROCEDURES:  Critical Care performed:   Procedures   MEDICATIONS ORDERED IN ED: Medications - No data to display   IMPRESSION / MDM / [place] / ED COURSE  I reviewed the triage vital signs and the nursing notes.   Differential diagnosis includes, but is not limited to, palpitations without chest pain.  76 year old male presents to the ED with complaint of palpitations for the last 3 weeks.  Patient denies any shortness of breath, chest pain, nausea or vomiting.  Patient states that he felt the palpitations more frequently when he  was up on a ladder sawing branches off of a tree.  Patient consumes the same amount of caffeine that he normally does and there are no other changes.  He called his PCP who told him that he would need to be evaluated in the emergency department.  EKG showed sinus rhythm with a ventricular rate of 97 with occasional PVCs.  Lab work was reassuring with troponin 3, CBC and CMP were normal.  Dr.Kinner was in to talk to the patient and wife about a plan for him to see cardiology at [facility] for a Holter monitor.  Patient is agreeable with plan.  Patient is continue regular medication.      Patient's presentation is most consistent with acute complicated illness / injury requiring diagnostic workup.  FINAL CLINICAL IMPRESSION(S) / ED DIAGNOSES   Final diagnoses:  PVC (premature ventricular contraction)     Rx / DC Orders   ED Discharge Orders          Ordered    Ambulatory  referral to Cardiology       Comments: If you have not heard from the Cardiology office within the next 72 hours please call [phone].   08/16/22 1406    metoprolol tartrate (LOPRESSOR) 25 MG tablet  2 times daily        08/16/22 1406             Note:  This document was prepared using Dragon voice recognition software and may include unintentional dictation errors.   Johnn Hai, PA-C 08/16/22 1425    Lavonia Drafts, MD 08/16/22 1430

## 2022-08-16 NOTE — ED Triage Notes (Signed)
Pt via POV from home. Pt c/o heart palpitations states that he feel like his heart has been skipping a beat for last 3 weeks but today it has gotten worse. Denies pain. Denies SOB. Denies hx of any arrhythmia or MI. Pt is A&OX4 and NAD

## 2022-08-17 ENCOUNTER — Ambulatory Visit (INDEPENDENT_AMBULATORY_CARE_PROVIDER_SITE_OTHER): Payer: PPO | Admitting: Internal Medicine

## 2022-08-17 ENCOUNTER — Encounter: Payer: Self-pay | Admitting: Internal Medicine

## 2022-08-17 VITALS — BP 140/76 | HR 71 | Ht 72.0 in | Wt 207.1 lb

## 2022-08-17 DIAGNOSIS — R351 Nocturia: Secondary | ICD-10-CM | POA: Diagnosis not present

## 2022-08-17 DIAGNOSIS — Z0000 Encounter for general adult medical examination without abnormal findings: Secondary | ICD-10-CM

## 2022-08-17 DIAGNOSIS — E785 Hyperlipidemia, unspecified: Secondary | ICD-10-CM

## 2022-08-17 DIAGNOSIS — Z23 Encounter for immunization: Secondary | ICD-10-CM | POA: Diagnosis not present

## 2022-08-17 DIAGNOSIS — N401 Enlarged prostate with lower urinary tract symptoms: Secondary | ICD-10-CM

## 2022-08-17 NOTE — Assessment & Plan Note (Signed)
Hypercholesterolemia  I advised the patient to follow Mediterranean diet This diet is rich in fruits vegetables and whole grain, and This diet is also rich in fish and lean meat Patient should also eat a handful of almonds or walnuts daily Recent heart study indicated that average follow-up on this kind of diet reduces the cardiovascular mortality by 50 to 70%== 

## 2022-08-17 NOTE — Assessment & Plan Note (Signed)
Patient does not want flu shot 1.

## 2022-08-17 NOTE — Assessment & Plan Note (Signed)
Chronic problem. 

## 2022-08-17 NOTE — Progress Notes (Addendum)
Established Patient Office Visit  Subjective:  Patient ID: [name], male    DOB: 1945-12-02  Age: 76 y.o. MRN: [id number]  CC:  Chief Complaint  Patient presents with   Annual Exam   Follow-up    Palpitations  This is a chronic problem. The current episode started 1 to 4 weeks ago. The problem occurs 2 to 4 times per day. The problem has been unchanged. The symptoms are aggravated by diet pills and alcohol. Associated symptoms include anxiety. Pertinent negatives include no chest pain, coughing, diaphoresis, shortness of breath or syncope.       Past Medical History:  Diagnosis Date   Actinic keratosis    Allergy    Arthritis    Basal cell carcinoma 08/12/2020   L post lat base of neck    BCC (basal cell carcinoma of skin) 02/17/2021   L infra auricular, exc 03/02/21   History of colon polyps    Hyperlipidemia    Hypertension    Melanoma ([place]) 2009   left top of shoulder. Dr. Koleen Nimrod   Seasonal allergies    Squamous cell carcinoma of skin 11/02/2017   left temple anterior to sideburn. [facility] 11/02/2017    Past Surgical History:  Procedure Laterality Date   COLONOSCOPY     MELANOMA EXCISION  2009   POLYPECTOMY     TOTAL KNEE ARTHROPLASTY Right 2011   TOTAL KNEE ARTHROPLASTY Left     Family History  Problem Relation Age of Onset   Colon cancer Mother        died age 26-dx'd late 7-0's   Esophageal cancer Neg Hx    Rectal cancer Neg Hx    Stomach cancer Neg Hx    Crohn's disease Neg Hx     Social History   Socioeconomic History   Marital status: Married    Spouse name: Not on file   Number of children: Not on file   Years of education: Not on file   Highest education level: Not on file  Occupational History   Not on file  Tobacco Use   Smoking status: Former    Types: Cigarettes    Quit date: 10/24/1972    Years since quitting: 49.8   Smokeless tobacco: Former    Quit date: 10/24/2002  Substance and Sexual Activity   Alcohol use: Yes     Alcohol/week: 3.0 standard drinks of alcohol    Types: 3 Glasses of wine per week   Drug use: No   Sexual activity: Not on file  Other Topics Concern   Not on file  Social History Narrative   Not on file   Social Determinants of Health   Financial Resource Strain: Not on file  Food Insecurity: Not on file  Transportation Needs: Not on file  Physical Activity: Not on file  Stress: Not on file  Social Connections: Not on file  Intimate Partner Violence: Not on file     Current Outpatient Medications:    amLODipine (NORVASC) 5 MG tablet, TAKE 1 TABLET BY MOUTH EVERY DAY, Disp: 90 tablet, Rfl: 3   losartan (COZAAR) 100 MG tablet, TAKE 1 TABLET BY MOUTH EVERY DAY, Disp: 90 tablet, Rfl: 1   metoprolol tartrate (LOPRESSOR) 25 MG tablet, Take 0.5 tablets (12.5 mg total) by mouth 2 (two) times daily., Disp: 30 tablet, Rfl: 1   milk thistle 175 MG tablet, Take 250 mg by mouth daily., Disp: , Rfl:    Multiple Vitamins-Minerals (CENTRUM SILVER PO), Take by  mouth., Disp: , Rfl:    Omega-3 Fatty Acids (FISH OIL) 1000 MG CAPS, Take by mouth., Disp: , Rfl:    Psyllium (METAMUCIL PO), Take 2 scoop by mouth daily., Disp: , Rfl:    rosuvastatin (CRESTOR) 10 MG tablet, TAKE 1 TABLET BY MOUTH EVERY DAY, Disp: 90 tablet, Rfl: 2   No Known Allergies  ROS Review of Systems  Constitutional: Negative.  Negative for diaphoresis.  HENT: Negative.    Eyes: Negative.   Respiratory: Negative.  Negative for cough and shortness of breath.   Cardiovascular:  Positive for palpitations. Negative for chest pain and syncope.  Gastrointestinal: Negative.   Endocrine: Negative.   Genitourinary: Negative.   Musculoskeletal: Negative.   Skin: Negative.   Allergic/Immunologic: Negative.   Neurological: Negative.   Hematological: Negative.   Psychiatric/Behavioral:  The patient is nervous/anxious.   All other systems reviewed and are negative.     Objective:    Physical Exam Vitals reviewed.   Constitutional:      Appearance: Normal appearance.  HENT:     Mouth/Throat:     Mouth: Mucous membranes are moist.  Eyes:     Pupils: Pupils are equal, round, and reactive to light.  Neck:     Vascular: No carotid bruit.  Cardiovascular:     Rate and Rhythm: Normal rate and regular rhythm.     Pulses: Normal pulses.     Heart sounds: Normal heart sounds.  Pulmonary:     Effort: Pulmonary effort is normal.     Breath sounds: Normal breath sounds.  Abdominal:     General: Bowel sounds are normal.     Palpations: Abdomen is soft. There is no hepatomegaly, splenomegaly or mass.     Tenderness: There is no abdominal tenderness.     Hernia: No hernia is present.  Musculoskeletal:     Cervical back: Neck supple.     Right lower leg: No edema.     Left lower leg: No edema.  Skin:    Findings: No rash.  Neurological:     Mental Status: He is alert and oriented to person, place, and time.     Motor: No weakness.  Psychiatric:        Mood and Affect: Mood normal.        Behavior: Behavior normal.     BP (!) 140/76   Pulse 71   Ht 6' (1.829 m)   Wt 207 lb 1.6 oz (93.9 kg)   BMI 28.09 kg/m  Wt Readings from Last 3 Encounters:  08/17/22 207 lb 1.6 oz (93.9 kg)  08/16/22 205 lb (93 kg)  08/26/21 204 lb (92.5 kg)     There are no preventive care reminders to display for this patient.  There are no preventive care reminders to display for this patient.  Lab Results  Component Value Date   TSH 0.98 08/04/2021   Lab Results  Component Value Date   WBC 7.4 08/16/2022   HGB 14.4 08/16/2022   HCT 44.6 08/16/2022   MCV 93.9 08/16/2022   PLT 247 08/16/2022   Lab Results  Component Value Date   NA 138 08/16/2022   K 3.7 08/16/2022   CO2 26 08/16/2022   GLUCOSE 103 (H) 08/16/2022   BUN 15 08/16/2022   CREATININE 1.07 08/16/2022   BILITOT 1.0 08/16/2022   ALKPHOS 50 08/16/2022   AST 28 08/16/2022   ALT 29 08/16/2022   PROT 7.5 08/16/2022   ALBUMIN 4.1 08/16/2022  CALCIUM 9.4 08/16/2022   ANIONGAP 8 08/16/2022   EGFR 86 08/04/2021   Lab Results  Component Value Date   CHOL 147 08/04/2021   Lab Results  Component Value Date   HDL 65 08/04/2021   Lab Results  Component Value Date   LDLCALC 68 08/04/2021   Lab Results  Component Value Date   TRIG 62 08/04/2021   Lab Results  Component Value Date   CHOLHDL 2.3 08/04/2021   Lab Results  Component Value Date   HGBA1C 5.3 08/04/2021      Assessment & Plan:   Problem List Items Addressed This Visit       Other   Need for influenza vaccination    Patient does not want flu shot 1.      Dyslipidemia - Primary    Hypercholesterolemia  I advised the patient to follow Mediterranean diet This diet is rich in fruits vegetables and whole grain, and This diet is also rich in fish and lean meat Patient should also eat a handful of almonds or walnuts daily Recent heart study indicated that average follow-up on this kind of diet reduces the cardiovascular mortality by 50 to 70%==      BPH associated with nocturia    Chronic problem      I will order echo Holter and a stress test.,  He was advised to take 1 baby aspirin twice a week, patient does not want to take a flu shot, his labs from the hospital and emergency room note was reviewed       Follow-up: No follow-ups on file.    Cletis Athens, MD

## 2022-08-18 DIAGNOSIS — E785 Hyperlipidemia, unspecified: Secondary | ICD-10-CM | POA: Diagnosis not present

## 2022-08-18 DIAGNOSIS — N401 Enlarged prostate with lower urinary tract symptoms: Secondary | ICD-10-CM | POA: Diagnosis not present

## 2022-08-18 DIAGNOSIS — R351 Nocturia: Secondary | ICD-10-CM | POA: Diagnosis not present

## 2022-08-18 DIAGNOSIS — Z0000 Encounter for general adult medical examination without abnormal findings: Secondary | ICD-10-CM | POA: Diagnosis not present

## 2022-08-18 NOTE — Addendum Note (Signed)
Addended by: Alois Cliche on: 08/18/2022 03:46 PM   Modules accepted: Orders

## 2022-08-19 LAB — CBC WITH DIFFERENTIAL/PLATELET
Absolute Monocytes: 502 cells/uL (ref 200–950)
Basophils Absolute: 53 cells/uL (ref 0–200)
Basophils Relative: 0.8 %
Eosinophils Absolute: 139 cells/uL (ref 15–500)
Eosinophils Relative: 2.1 %
HCT: 45.1 % (ref 38.5–50.0)
Hemoglobin: 15.3 g/dL (ref 13.2–17.1)
Lymphs Abs: 2369 cells/uL (ref 850–3900)
MCH: 31.4 pg (ref 27.0–33.0)
MCHC: 33.9 g/dL (ref 32.0–36.0)
MCV: 92.4 fL (ref 80.0–100.0)
MPV: 10.9 fL (ref 7.5–12.5)
Monocytes Relative: 7.6 %
Neutro Abs: 3538 cells/uL (ref 1500–7800)
Neutrophils Relative %: 53.6 %
Platelets: 279 10*3/uL (ref 140–400)
RBC: 4.88 10*6/uL (ref 4.20–5.80)
RDW: 13.3 % (ref 11.0–15.0)
Total Lymphocyte: 35.9 %
WBC: 6.6 10*3/uL (ref 3.8–10.8)

## 2022-08-19 LAB — COMPLETE METABOLIC PANEL WITH GFR
AG Ratio: 1.7 (calc) (ref 1.0–2.5)
ALT: 27 U/L (ref 9–46)
AST: 25 U/L (ref 10–35)
Albumin: 4.7 g/dL (ref 3.6–5.1)
Alkaline phosphatase (APISO): 55 U/L (ref 35–144)
BUN: 12 mg/dL (ref 7–25)
CO2: 26 mmol/L (ref 20–32)
Calcium: 9.9 mg/dL (ref 8.6–10.3)
Chloride: 103 mmol/L (ref 98–110)
Creat: 1.05 mg/dL (ref 0.70–1.28)
Globulin: 2.7 g/dL (calc) (ref 1.9–3.7)
Glucose, Bld: 101 mg/dL — ABNORMAL HIGH (ref 65–99)
Potassium: 4.7 mmol/L (ref 3.5–5.3)
Sodium: 139 mmol/L (ref 135–146)
Total Bilirubin: 0.8 mg/dL (ref 0.2–1.2)
Total Protein: 7.4 g/dL (ref 6.1–8.1)
eGFR: 74 mL/min/{1.73_m2} (ref >=60)

## 2022-08-19 LAB — TSH: TSH: 0.92 mIU/L (ref 0.40–4.50)

## 2022-08-19 LAB — LIPID PANEL
Cholesterol: 162 mg/dL (ref <200)
HDL: 68 mg/dL (ref >=40)
LDL Cholesterol (Calc): 76 mg/dL (calc)
Non-HDL Cholesterol (Calc): 94 mg/dL (calc) (ref <130)
Total CHOL/HDL Ratio: 2.4 (calc) (ref <5.0)
Triglycerides: 92 mg/dL (ref <150)

## 2022-08-19 LAB — PSA: PSA: 2.05 ng/mL (ref <=4.00)

## 2022-08-22 ENCOUNTER — Ambulatory Visit (INDEPENDENT_AMBULATORY_CARE_PROVIDER_SITE_OTHER): Payer: PPO | Admitting: Internal Medicine

## 2022-08-22 DIAGNOSIS — E785 Hyperlipidemia, unspecified: Secondary | ICD-10-CM | POA: Diagnosis not present

## 2022-08-22 DIAGNOSIS — R002 Palpitations: Secondary | ICD-10-CM

## 2022-08-22 HISTORY — PX: TRANSTHORACIC ECHOCARDIOGRAM: SHX275

## 2022-08-22 HISTORY — PX: OTHER SURGICAL HISTORY: SHX169

## 2022-08-23 ENCOUNTER — Encounter: Payer: Self-pay | Admitting: Internal Medicine

## 2022-08-23 ENCOUNTER — Ambulatory Visit (INDEPENDENT_AMBULATORY_CARE_PROVIDER_SITE_OTHER): Payer: PPO | Admitting: Internal Medicine

## 2022-08-23 DIAGNOSIS — R079 Chest pain, unspecified: Secondary | ICD-10-CM | POA: Insufficient documentation

## 2022-08-23 DIAGNOSIS — R002 Palpitations: Secondary | ICD-10-CM | POA: Insufficient documentation

## 2022-08-23 NOTE — Progress Notes (Signed)
 Established Patient Office Visit  Subjective:  Patient ID: [name], male    DOB: 08/02/1946  Age: 76 y.o. MRN: [id number]  CC: No chief complaint on file.   HPI  Roni P Colello presents for echocardiogram.  Past Medical History:  Diagnosis Date   Actinic keratosis    Allergy    Arthritis    Basal cell carcinoma 08/12/2020   L post lat base of neck    BCC (basal cell carcinoma of skin) 02/17/2021   L infra auricular, exc 03/02/21   History of colon polyps    Hyperlipidemia    Hypertension    Melanoma ([facility]) 2009   left top of shoulder. Dr. Henderson   Seasonal allergies    Squamous cell carcinoma of skin 11/02/2017   left temple anterior to sideburn. [facility] 11/02/2017    Past Surgical History:  Procedure Laterality Date   COLONOSCOPY     MELANOMA EXCISION  2009   POLYPECTOMY     TOTAL KNEE ARTHROPLASTY Right 2011   TOTAL KNEE ARTHROPLASTY Left     Family History  Problem Relation Age of Onset   Colon cancer Mother        died age 92-dx'd late 7-0's   Esophageal cancer Neg Hx    Rectal cancer Neg Hx    Stomach cancer Neg Hx    Crohn's disease Neg Hx     Social History   Socioeconomic History   Marital status: Married    Spouse name: Not on file   Number of children: Not on file   Years of education: Not on file   Highest education level: Not on file  Occupational History   Not on file  Tobacco Use   Smoking status: Former    Types: Cigarettes    Quit date: 10/24/1972    Years since quitting: 49.8   Smokeless tobacco: Former    Quit date: 10/24/2002  Substance and Sexual Activity   Alcohol use: Yes    Alcohol/week: 3.0 standard drinks of alcohol    Types: 3 Glasses of wine per week   Drug use: No   Sexual activity: Not on file  Other Topics Concern   Not on file  Social History Narrative   Not on file   Social Determinants of Health   Financial Resource Strain: Not on file  Food Insecurity: Not on file  Transportation Needs: Not on file   Physical Activity: Not on file  Stress: Not on file  Social Connections: Not on file  Intimate Partner Violence: Not on file     Current Outpatient Medications:    amLODipine (NORVASC) 5 MG tablet, TAKE 1 TABLET BY MOUTH EVERY DAY, Disp: 90 tablet, Rfl: 3   losartan (COZAAR) 100 MG tablet, TAKE 1 TABLET BY MOUTH EVERY DAY, Disp: 90 tablet, Rfl: 1   metoprolol tartrate (LOPRESSOR) 25 MG tablet, Take 0.5 tablets (12.5 mg total) by mouth 2 (two) times daily., Disp: 30 tablet, Rfl: 1   milk thistle 175 MG tablet, Take 250 mg by mouth daily., Disp: , Rfl:    Multiple Vitamins-Minerals (CENTRUM SILVER PO), Take by mouth., Disp: , Rfl:    Omega-3 Fatty Acids (FISH OIL) 1000 MG CAPS, Take by mouth., Disp: , Rfl:    Psyllium (METAMUCIL PO), Take 2 scoop by mouth daily., Disp: , Rfl:    rosuvastatin (CRESTOR) 10 MG tablet, TAKE 1 TABLET BY MOUTH EVERY DAY, Disp: 90 tablet, Rfl: 2   No Known Allergies  ROS   Review of Systems  Constitutional: Negative.   HENT: Negative.    Eyes: Negative.   Respiratory: Negative.    Cardiovascular:  Positive for palpitations.  Gastrointestinal: Negative.   Endocrine: Negative.   Genitourinary: Negative.   Musculoskeletal: Negative.   Skin: Negative.   Allergic/Immunologic: Negative.   Neurological: Negative.   Hematological: Negative.   Psychiatric/Behavioral: Negative.    All other systems reviewed and are negative.     Objective:    Physical Exam Vitals reviewed.  Constitutional:      Appearance: Normal appearance.  HENT:     Mouth/Throat:     Mouth: Mucous membranes are moist.  Eyes:     Pupils: Pupils are equal, round, and reactive to light.  Neck:     Vascular: No carotid bruit.  Cardiovascular:     Rate and Rhythm: Normal rate and regular rhythm.     Pulses: Normal pulses.     Heart sounds: Normal heart sounds.  Pulmonary:     Effort: Pulmonary effort is normal.     Breath sounds: Normal breath sounds.  Abdominal:     General:  Bowel sounds are normal.     Palpations: Abdomen is soft. There is no hepatomegaly, splenomegaly or mass.     Tenderness: There is no abdominal tenderness.     Hernia: No hernia is present.  Musculoskeletal:     Cervical back: Neck supple.     Right lower leg: No edema.     Left lower leg: No edema.  Skin:    Findings: No rash.  Neurological:     Mental Status: He is alert and oriented to person, place, and time.     Motor: No weakness.  Psychiatric:        Mood and Affect: Mood normal.        Behavior: Behavior normal.     There were no vitals taken for this visit. Wt Readings from Last 3 Encounters:  08/17/22 207 lb 1.6 oz (93.9 kg)  08/16/22 205 lb (93 kg)  08/26/21 204 lb (92.5 kg)     There are no preventive care reminders to display for this patient.  There are no preventive care reminders to display for this patient.  Lab Results  Component Value Date   TSH 0.92 08/18/2022   Lab Results  Component Value Date   WBC 6.6 08/18/2022   HGB 15.3 08/18/2022   HCT 45.1 08/18/2022   MCV 92.4 08/18/2022   PLT 279 08/18/2022   Lab Results  Component Value Date   NA 139 08/18/2022   K 4.7 08/18/2022   CO2 26 08/18/2022   GLUCOSE 101 (H) 08/18/2022   BUN 12 08/18/2022   CREATININE 1.05 08/18/2022   BILITOT 0.8 08/18/2022   ALKPHOS 50 08/16/2022   AST 25 08/18/2022   ALT 27 08/18/2022   PROT 7.4 08/18/2022   ALBUMIN 4.1 08/16/2022   CALCIUM 9.9 08/18/2022   ANIONGAP 8 08/16/2022   EGFR 74 08/18/2022   Lab Results  Component Value Date   CHOL 162 08/18/2022   Lab Results  Component Value Date   HDL 68 08/18/2022   Lab Results  Component Value Date   LDLCALC 76 08/18/2022   Lab Results  Component Value Date   TRIG 92 08/18/2022   Lab Results  Component Value Date   CHOLHDL 2.4 08/18/2022   Lab Results  Component Value Date   HGBA1C 5.3 08/04/2021      Assessment & Plan:   Problem List   Items Addressed This Visit       Other    Dyslipidemia   Palpitation - Primary    [facility] [address] [place], [place] [address] Phone: [phone] Fax:  [phone]  Transthoracic Echocardiogram Note  Dvid P Linquist [id number] 07/06/1946  Procedure: Transthoracic Echocardiogram Indications: Palpitation Verbal Consent: Obtained  Procedure Details   Technical quality: good  Resting Measurements: Grossly within normal limits  Left Ventrical: Left ventricular size is normal contractility is good no wall motion abnormalities were noted, patient has an ejection fraction of 55% no pericardial effusion is noted right ventricular size is normal no valvular abnormalities noted.  Mitral Valve: Structure is normal  Aortic Valve: Structure is normal  Tricuspid Valve: Within normal limits  Left Atrium/ Left atrial appendage:  No blood clots noted ascending aorta is normal Atrial septum:   Aorta: Ascending aorta is normal   Complications: No apparent complications Patient did tolerate procedure well.  Javed Masoud, MD   No orders of the defined types were placed in this encounter.   Follow-up: No follow-ups on file.    Javed Masoud, MD 

## 2022-08-23 NOTE — Assessment & Plan Note (Signed)
atypical

## 2022-08-23 NOTE — Progress Notes (Signed)
Established Patient Office Visit  Subjective:  Patient ID: [name], male    DOB: 10/29/1945  Age: 76 y.o. MRN: [id number]  CC: No chief complaint on file.   HPI  [name] presents for stress test, cardiac arrhythmia  Past Medical History:  Diagnosis Date   Actinic keratosis    Allergy    Arthritis    Basal cell carcinoma 08/12/2020   L post lat base of neck    BCC (basal cell carcinoma of skin) 02/17/2021   L infra auricular, exc 03/02/21   History of colon polyps    Hyperlipidemia    Hypertension    Melanoma ([place]) 2009   left top of shoulder. Dr. Koleen Nimrod   Seasonal allergies    Squamous cell carcinoma of skin 11/02/2017   left temple anterior to sideburn. [facility] 11/02/2017    Past Surgical History:  Procedure Laterality Date   COLONOSCOPY     MELANOMA EXCISION  2009   POLYPECTOMY     TOTAL KNEE ARTHROPLASTY Right 2011   TOTAL KNEE ARTHROPLASTY Left     Family History  Problem Relation Age of Onset   Colon cancer Mother        died age 79-dx'd late 7-0's   Esophageal cancer Neg Hx    Rectal cancer Neg Hx    Stomach cancer Neg Hx    Crohn's disease Neg Hx     Social History   Socioeconomic History   Marital status: Married    Spouse name: Not on file   Number of children: Not on file   Years of education: Not on file   Highest education level: Not on file  Occupational History   Not on file  Tobacco Use   Smoking status: Former    Types: Cigarettes    Quit date: 10/24/1972    Years since quitting: 49.8   Smokeless tobacco: Former    Quit date: 10/24/2002  Substance and Sexual Activity   Alcohol use: Yes    Alcohol/week: 3.0 standard drinks of alcohol    Types: 3 Glasses of wine per week   Drug use: No   Sexual activity: Not on file  Other Topics Concern   Not on file  Social History Narrative   Not on file   Social Determinants of Health   Financial Resource Strain: Not on file  Food Insecurity: Not on file  Transportation Needs:  Not on file  Physical Activity: Not on file  Stress: Not on file  Social Connections: Not on file  Intimate Partner Violence: Not on file     Current Outpatient Medications:    amLODipine (NORVASC) 5 MG tablet, TAKE 1 TABLET BY MOUTH EVERY DAY, Disp: 90 tablet, Rfl: 3   losartan (COZAAR) 100 MG tablet, TAKE 1 TABLET BY MOUTH EVERY DAY, Disp: 90 tablet, Rfl: 1   metoprolol tartrate (LOPRESSOR) 25 MG tablet, Take 0.5 tablets (12.5 mg total) by mouth 2 (two) times daily., Disp: 30 tablet, Rfl: 1   milk thistle 175 MG tablet, Take 250 mg by mouth daily., Disp: , Rfl:    Multiple Vitamins-Minerals (CENTRUM SILVER PO), Take by mouth., Disp: , Rfl:    Omega-3 Fatty Acids (FISH OIL) 1000 MG CAPS, Take by mouth., Disp: , Rfl:    Psyllium (METAMUCIL PO), Take 2 scoop by mouth daily., Disp: , Rfl:    rosuvastatin (CRESTOR) 10 MG tablet, TAKE 1 TABLET BY MOUTH EVERY DAY, Disp: 90 tablet, Rfl: 2   No Known  Allergies  ROS Review of Systems  Constitutional: Negative.   HENT: Negative.    Eyes: Negative.   Respiratory: Negative.    Cardiovascular:  Positive for palpitations.  Gastrointestinal: Negative.   Endocrine: Negative.   Genitourinary: Negative.   Musculoskeletal: Negative.   Skin: Negative.   Allergic/Immunologic: Negative.   Neurological: Negative.   Hematological: Negative.   Psychiatric/Behavioral: Negative.    All other systems reviewed and are negative.     Objective:    Physical Exam Vitals reviewed.  Constitutional:      Appearance: Normal appearance.  HENT:     Mouth/Throat:     Mouth: Mucous membranes are moist.  Eyes:     Pupils: Pupils are equal, round, and reactive to light.  Neck:     Vascular: No carotid bruit.  Cardiovascular:     Rate and Rhythm: Normal rate and regular rhythm.     Pulses: Normal pulses.     Heart sounds: Normal heart sounds.  Pulmonary:     Effort: Pulmonary effort is normal.     Breath sounds: Normal breath sounds.  Abdominal:      General: Bowel sounds are normal.     Palpations: Abdomen is soft. There is no hepatomegaly, splenomegaly or mass.     Tenderness: There is no abdominal tenderness.     Hernia: No hernia is present.  Musculoskeletal:     Cervical back: Neck supple.     Right lower leg: No edema.     Left lower leg: No edema.  Skin:    Findings: No rash.  Neurological:     Mental Status: He is alert and oriented to person, place, and time.     Motor: No weakness.  Psychiatric:        Mood and Affect: Mood normal.        Behavior: Behavior normal.     There were no vitals taken for this visit. Wt Readings from Last 3 Encounters:  08/17/22 207 lb 1.6 oz (93.9 kg)  08/16/22 205 lb (93 kg)  08/26/21 204 lb (92.5 kg)     There are no preventive care reminders to display for this patient.  There are no preventive care reminders to display for this patient.  Lab Results  Component Value Date   TSH 0.92 08/18/2022   Lab Results  Component Value Date   WBC 6.6 08/18/2022   HGB 15.3 08/18/2022   HCT 45.1 08/18/2022   MCV 92.4 08/18/2022   PLT 279 08/18/2022   Lab Results  Component Value Date   NA 139 08/18/2022   K 4.7 08/18/2022   CO2 26 08/18/2022   GLUCOSE 101 (H) 08/18/2022   BUN 12 08/18/2022   CREATININE 1.05 08/18/2022   BILITOT 0.8 08/18/2022   ALKPHOS 50 08/16/2022   AST 25 08/18/2022   ALT 27 08/18/2022   PROT 7.4 08/18/2022   ALBUMIN 4.1 08/16/2022   CALCIUM 9.9 08/18/2022   ANIONGAP 8 08/16/2022   EGFR 74 08/18/2022   Lab Results  Component Value Date   CHOL 162 08/18/2022   Lab Results  Component Value Date   HDL 68 08/18/2022   Lab Results  Component Value Date   LDLCALC 76 08/18/2022   Lab Results  Component Value Date   TRIG 92 08/18/2022   Lab Results  Component Value Date   CHOLHDL 2.4 08/18/2022   Lab Results  Component Value Date   HGBA1C 5.3 08/04/2021      Assessment & Plan:  Problem List Items Addressed This Visit   None Visit  Diagnoses     Chest pain, unspecified type    -  Primary   Relevant Orders   Ambulatory referral to Cardiology      Report of the stress test. Indication palpitation  Patient was exercised according to modified Bruce protocol at the speed of 1.7 miles an hour at 10% grade and at the speed of 2.5 miles an hour at 12% grade.  Exercise test was stopped at the end of 10 minutes due to shortness of breath and tiredness patient did not have any chest pain, BP 150/80 PVCs or aberrant beats were seen during the stress test.  Stress test is abnormal because of cardiac arrhythmia patient does not have any angina chest pain ST changes suggestive of myocardial ischemia.  We will schedule the patient for CT angio      No orders of the defined types were placed in this encounter.   Follow-up: No follow-ups on file.    Cletis Athens, MD

## 2022-08-23 NOTE — Assessment & Plan Note (Signed)
Will get stress test.  

## 2022-08-24 ENCOUNTER — Ambulatory Visit (INDEPENDENT_AMBULATORY_CARE_PROVIDER_SITE_OTHER): Payer: PPO | Admitting: Internal Medicine

## 2022-08-24 ENCOUNTER — Encounter: Payer: Self-pay | Admitting: Internal Medicine

## 2022-08-24 VITALS — BP 137/83 | HR 60 | Ht 72.0 in | Wt 206.9 lb

## 2022-08-24 DIAGNOSIS — E785 Hyperlipidemia, unspecified: Secondary | ICD-10-CM

## 2022-08-24 DIAGNOSIS — R002 Palpitations: Secondary | ICD-10-CM | POA: Diagnosis not present

## 2022-08-24 DIAGNOSIS — N401 Enlarged prostate with lower urinary tract symptoms: Secondary | ICD-10-CM

## 2022-08-24 DIAGNOSIS — R351 Nocturia: Secondary | ICD-10-CM

## 2022-08-24 DIAGNOSIS — Z0000 Encounter for general adult medical examination without abnormal findings: Secondary | ICD-10-CM | POA: Diagnosis not present

## 2022-08-24 DIAGNOSIS — Z23 Encounter for immunization: Secondary | ICD-10-CM

## 2022-08-24 MED ORDER — LOSARTAN POTASSIUM 100 MG PO TABS: 100.0000 mg | ORAL_TABLET | Freq: Every day | ORAL | 1 refills | Status: AC

## 2022-08-24 MED ORDER — METOPROLOL TARTRATE 25 MG PO TABS: 25.0000 mg | ORAL_TABLET | Freq: Two times a day (BID) | ORAL | 1 refills | Status: DC

## 2022-08-24 NOTE — Assessment & Plan Note (Signed)
PSA is normal prostate examination is normal

## 2022-08-24 NOTE — Assessment & Plan Note (Signed)
Holter monitor did not show any V. tach

## 2022-08-24 NOTE — Progress Notes (Signed)
Established Patient Office Visit  Subjective:  Patient ID: [name], male    DOB: 12-18-1945  Age: 76 y.o. MRN: [id number]  CC:  Chief Complaint  Patient presents with   Medicare Wellness    HPI  [name] presents for physical  Past Medical History:  Diagnosis Date   Actinic keratosis    Allergy    Arthritis    Basal cell carcinoma 08/12/2020   L post lat base of neck    BCC (basal cell carcinoma of skin) 02/17/2021   L infra auricular, exc 03/02/21   History of colon polyps    Hyperlipidemia    Hypertension    Melanoma ([place]) 2009   left top of shoulder. Dr. Koleen Nimrod   Seasonal allergies    Squamous cell carcinoma of skin 11/02/2017   left temple anterior to sideburn. [facility] 11/02/2017    Past Surgical History:  Procedure Laterality Date   COLONOSCOPY     MELANOMA EXCISION  2009   POLYPECTOMY     TOTAL KNEE ARTHROPLASTY Right 2011   TOTAL KNEE ARTHROPLASTY Left     Family History  Problem Relation Age of Onset   Colon cancer Mother        died age 40-dx'd late 7-0's   Esophageal cancer Neg Hx    Rectal cancer Neg Hx    Stomach cancer Neg Hx    Crohn's disease Neg Hx     Social History   Socioeconomic History   Marital status: Married    Spouse name: Not on file   Number of children: Not on file   Years of education: Not on file   Highest education level: Not on file  Occupational History   Not on file  Tobacco Use   Smoking status: Former    Types: Cigarettes    Quit date: 10/24/1972    Years since quitting: 49.8   Smokeless tobacco: Former    Quit date: 10/24/2002  Substance and Sexual Activity   Alcohol use: Yes    Alcohol/week: 3.0 standard drinks of alcohol    Types: 3 Glasses of wine per week   Drug use: No   Sexual activity: Not on file  Other Topics Concern   Not on file  Social History Narrative   Not on file   Social Determinants of Health   Financial Resource Strain: Not on file  Food Insecurity: Not on file   Transportation Needs: Not on file  Physical Activity: Not on file  Stress: Not on file  Social Connections: Not on file  Intimate Partner Violence: Not on file     Current Outpatient Medications:    amLODipine (NORVASC) 5 MG tablet, TAKE 1 TABLET BY MOUTH EVERY DAY, Disp: 90 tablet, Rfl: 3   losartan (COZAAR) 100 MG tablet, Take 1 tablet (100 mg total) by mouth daily., Disp: 90 tablet, Rfl: 1   metoprolol tartrate (LOPRESSOR) 25 MG tablet, Take 1 tablet (25 mg total) by mouth 2 (two) times daily., Disp: 180 tablet, Rfl: 1   milk thistle 175 MG tablet, Take 250 mg by mouth daily., Disp: , Rfl:    Multiple Vitamins-Minerals (CENTRUM SILVER PO), Take by mouth., Disp: , Rfl:    Omega-3 Fatty Acids (FISH OIL) 1000 MG CAPS, Take by mouth., Disp: , Rfl:    Psyllium (METAMUCIL PO), Take 2 scoop by mouth daily., Disp: , Rfl:    rosuvastatin (CRESTOR) 10 MG tablet, TAKE 1 TABLET BY MOUTH EVERY DAY, Disp: 90 tablet,  Rfl: 2   No Known Allergies  ROS Review of Systems  Constitutional: Negative.   HENT: Negative.    Eyes: Negative.   Respiratory: Negative.    Cardiovascular: Negative.   Gastrointestinal: Negative.   Endocrine: Negative.   Genitourinary: Negative.   Musculoskeletal: Negative.   Skin: Negative.   Allergic/Immunologic: Negative.   Neurological: Negative.   Hematological: Negative.   Psychiatric/Behavioral: Negative.    All other systems reviewed and are negative.     Objective:    Physical Exam Vitals reviewed.  Constitutional:      Appearance: Normal appearance.  HENT:     Mouth/Throat:     Mouth: Mucous membranes are moist.  Eyes:     Pupils: Pupils are equal, round, and reactive to light.  Neck:     Vascular: No carotid bruit.  Cardiovascular:     Rate and Rhythm: Normal rate and regular rhythm.     Pulses: Normal pulses.     Heart sounds: Normal heart sounds.  Pulmonary:     Effort: Pulmonary effort is normal.     Breath sounds: Normal breath sounds.   Abdominal:     General: Bowel sounds are normal.     Palpations: Abdomen is soft. There is no hepatomegaly, splenomegaly or mass.     Tenderness: There is no abdominal tenderness.     Hernia: No hernia is present.  Genitourinary:    Prostate: Normal.     Rectum: Normal.  Musculoskeletal:        General: Normal range of motion.     Cervical back: Neck supple.     Right lower leg: No edema.     Left lower leg: No edema.  Skin:    General: Skin is warm.     Findings: No rash.  Neurological:     General: No focal deficit present.     Mental Status: He is alert and oriented to person, place, and time. Mental status is at baseline.     Cranial Nerves: No cranial nerve deficit.     Sensory: No sensory deficit.     Motor: No weakness.     Coordination: Coordination normal.     Gait: Gait normal.     Deep Tendon Reflexes: Reflexes normal.  Psychiatric:        Mood and Affect: Mood normal.        Behavior: Behavior normal.     BP 137/83   Pulse 60   Ht 6' (1.829 m)   Wt 206 lb 14.4 oz (93.8 kg)   BMI 28.06 kg/m  Wt Readings from Last 3 Encounters:  08/24/22 206 lb 14.4 oz (93.8 kg)  08/17/22 207 lb 1.6 oz (93.9 kg)  08/16/22 205 lb (93 kg)     There are no preventive care reminders to display for this patient.  There are no preventive care reminders to display for this patient.  Lab Results  Component Value Date   TSH 0.92 08/18/2022   Lab Results  Component Value Date   WBC 6.6 08/18/2022   HGB 15.3 08/18/2022   HCT 45.1 08/18/2022   MCV 92.4 08/18/2022   PLT 279 08/18/2022   Lab Results  Component Value Date   NA 139 08/18/2022   K 4.7 08/18/2022   CO2 26 08/18/2022   GLUCOSE 101 (H) 08/18/2022   BUN 12 08/18/2022   CREATININE 1.05 08/18/2022   BILITOT 0.8 08/18/2022   ALKPHOS 50 08/16/2022   AST 25 08/18/2022   ALT 27   08/18/2022   PROT 7.4 08/18/2022   ALBUMIN 4.1 08/16/2022   CALCIUM 9.9 08/18/2022   ANIONGAP 8 08/16/2022   EGFR 74 08/18/2022    Lab Results  Component Value Date   CHOL 162 08/18/2022   Lab Results  Component Value Date   HDL 68 08/18/2022   Lab Results  Component Value Date   LDLCALC 76 08/18/2022   Lab Results  Component Value Date   TRIG 92 08/18/2022   Lab Results  Component Value Date   CHOLHDL 2.4 08/18/2022   Lab Results  Component Value Date   HGBA1C 5.3 08/04/2021      Assessment & Plan:   Problem List Items Addressed This Visit       Other   Annual physical exam    General physical exam is normal except for cardiac arrhythmia as described echocardiogram shows good LV function right ventricular function is good with no pericardial effusion stress test does not show any angina ST changes suggestive of myocardial ischemia occasional ectopics were noted mostly 8.  Ectopic with aberrancy.  Patient is on beta-blocker 25 mg p.o. twice a day, will try to get a CT angio of the chest if possible      Need for influenza vaccination - Primary    Patient will be given flu shot      Dyslipidemia    Hypercholesterolemia  I advised the patient to follow Mediterranean diet This diet is rich in fruits vegetables and whole grain, and This diet is also rich in fish and lean meat Patient should also eat a handful of almonds or walnuts daily Recent heart study indicated that average follow-up on this kind of diet reduces the cardiovascular mortality by 50 to 70%==      BPH associated with nocturia    PSA is normal prostate examination is normal      Palpitation    Holter monitor did not show any V. tach       Meds ordered this encounter  Medications   losartan (COZAAR) 100 MG tablet    Sig: Take 1 tablet (100 mg total) by mouth daily.    Dispense:  90 tablet    Refill:  1   metoprolol tartrate (LOPRESSOR) 25 MG tablet    Sig: Take 1 tablet (25 mg total) by mouth 2 (two) times daily.    Dispense:  180 tablet    Refill:  1   Report of the Holter monitor.,  Patient has okayed it.   And ventricular ectopy, no runs of ventricular tachycardia for episode of bigeminy was noted Heart rate  maximum 135/ lowest Heart rate 56 no ST changes were noted Follow-up: No follow-ups on file.    Javed Masoud, MD 

## 2022-08-24 NOTE — Assessment & Plan Note (Signed)
Hypercholesterolemia  I advised the patient to follow Mediterranean diet This diet is rich in fruits vegetables and whole grain, and This diet is also rich in fish and lean meat Patient should also eat a handful of almonds or walnuts daily Recent heart study indicated that average follow-up on this kind of diet reduces the cardiovascular mortality by 50 to 70%== 

## 2022-08-24 NOTE — Assessment & Plan Note (Signed)
General physical exam is normal except for cardiac arrhythmia as described echocardiogram shows good LV function right ventricular function is good with no pericardial effusion stress test does not show any angina ST changes suggestive of myocardial ischemia occasional ectopics were noted mostly 8.  Ectopic with aberrancy.  Patient is on beta-blocker 25 mg p.o. twice a day, will try to get a CT angio of the chest if possible

## 2022-08-24 NOTE — Assessment & Plan Note (Signed)
Patient will be given flu shot

## 2022-09-13 NOTE — Progress Notes (Unsigned)
Primary Care Provider: Corky Downs, MD [facility] Cardiologist: None Electrophysiologist: None  Clinic Note: No chief complaint on file.  ===================================  ASSESSMENT/PLAN   Problem List Items Addressed This Visit     Chest pain   Dyslipidemia   Palpitation - Primary    ===================================  HPI:    [name] is a 76 y.o. male with PMH notable for HTN, HLD who is being seen today for the evaluation of *** at the request of Corky Downs, MD.  Recent Hospitalizations:  [name] ED 08/16/2022: Complaints of heart palpitations lasting 3 weeks but worse upon presentation.  Noted symptoms while trimming limbs off of a tree up on a ladder.  He noted his heart skipping a beat.  No associated dyspnea dizziness or chest pain.  The positive family history of heart disease (unknown).  2 cups of coffee with occasional beer or wine.  No chest pain.  Was hypertensive-164/84 mmHg.  EKG showed PVCs with left axis deviation. => Recommended OP cardiology evaluation.  Given prescription for Lopressor 25 mg twice daily  Lavell P Mellone was seen on 3 different days by Dr. Harl Bowie following this ER visit. Noted that palpitations were probably exacerbated by diet pills and alcohol.  Also anxiety.  No chest pain coughing or dyspnea.No syncope or near. => Holter monitor, echo and stress test ordered.  Reviewed  CV studies:    The following studies were reviewed today: (if available, images/films reviewed: From Epic Chart or Care Everywhere) 2D Echo 08/22/2022: Normal LV size and function.  EF 55%.  No WMA.  No effusion.  Normal valves. GXT 08/23/2022: Exercised for 10 minutes.  Stopped due to dyspnea and tiredness/fatigue.  No chest pain.  PVC/aberrant beats noted during stress test.  No anginal pain or EKG changes.  Referred for CT angiogram/referred to cardiology Holter monitor did not show VT   Interval History:   [name]   CV Review of Symptoms  (Summary): {roscv:310661}  REVIEWED OF SYSTEMS   ROS  I have reviewed and (if needed) personally updated the patient's problem list, medications, allergies, past medical and surgical history, social and family history.   PAST MEDICAL HISTORY   Past Medical History:  Diagnosis Date   Actinic keratosis    Allergy    Arthritis    Basal cell carcinoma 08/12/2020   L post lat base of neck    BCC (basal cell carcinoma of skin) 02/17/2021   L infra auricular, exc 03/02/21   History of colon polyps    Hyperlipidemia    Hypertension    Melanoma ([facility]) 2009   left top of shoulder. Dr. Orson Aloe   Seasonal allergies    Squamous cell carcinoma of skin 11/02/2017   left temple anterior to sideburn. [facility] 11/02/2017    PAST SURGICAL HISTORY   Past Surgical History:  Procedure Laterality Date   COLONOSCOPY     MELANOMA EXCISION  2009   POLYPECTOMY     TOTAL KNEE ARTHROPLASTY Right 2011   TOTAL KNEE ARTHROPLASTY Left     Immunization History  Administered Date(s) Administered   Fluad Quad(high Dose 65+) 08/10/2021    MEDICATIONS/ALLERGIES   No outpatient medications have been marked as taking for the 09/14/22 encounter (Appointment) with Marykay Lex, MD.    No Known Allergies  SOCIAL HISTORY/FAMILY HISTORY   Reviewed in Epic:   Social History   Tobacco Use   Smoking status: Former    Types: Cigarettes    Quit  date: 10/24/1972    Years since quitting: 49.9   Smokeless tobacco: Former    Quit date: 10/24/2002  Substance Use Topics   Alcohol use: Yes    Alcohol/week: 3.0 standard drinks of alcohol    Types: 3 Glasses of wine per week   Drug use: No   Social History   Social History Narrative   Not on file   Family History  Problem Relation Age of Onset   Colon cancer Mother        died age 85-dx'd late 7-0's   Esophageal cancer Neg Hx    Rectal cancer Neg Hx    Stomach cancer Neg Hx    Crohn's disease Neg Hx     OBJCTIVE -PE, EKG, labs   Wt Readings  from Last 3 Encounters:  08/24/22 206 lb 14.4 oz (93.8 kg)  08/17/22 207 lb 1.6 oz (93.9 kg)  08/16/22 205 lb (93 kg)    Physical Exam: There were no vitals taken for this visit. Physical Exam   Adult ECG Report  Rate: *** ;  Rhythm: {rhythm:17366};   Narrative Interpretation: ***  Recent Labs:  ***  Lab Results  Component Value Date   CHOL 162 08/18/2022   HDL 68 08/18/2022   LDLCALC 76 08/18/2022   TRIG 92 08/18/2022   CHOLHDL 2.4 08/18/2022   Lab Results  Component Value Date   CREATININE 1.05 08/18/2022   BUN 12 08/18/2022   NA 139 08/18/2022   K 4.7 08/18/2022   CL 103 08/18/2022   CO2 26 08/18/2022      Latest Ref Rng & Units 08/18/2022    3:46 PM 08/16/2022   12:36 PM 08/04/2021    9:12 AM  CBC  WBC 3.8 - 10.8 Thousand/uL 6.6  7.4  6.1   Hemoglobin 13.2 - 17.1 g/dL 16.1  09.6  04.5   Hematocrit 38.5 - 50.0 % 45.1  44.6  45.2   Platelets 140 - 400 Thousand/uL 279  247  220     Lab Results  Component Value Date   HGBA1C 5.3 08/04/2021   Lab Results  Component Value Date   TSH 0.92 08/18/2022    ================================================== I spent a total of *** minutes with the patient spent in direct patient consultation.  Additional time spent with chart review  / charting (studies, outside notes, etc): *** min Total Time: *** min  Current medicines are reviewed at length with the patient today.  (+/- concerns) ***  Notice: This dictation was prepared with Dragon dictation along with smart phrase technology. Any transcriptional errors that result from this process are unintentional and may not be corrected upon review.   Studies Ordered:  No orders of the defined types were placed in this encounter.  No orders of the defined types were placed in this encounter.   Patient Instructions / Medication Changes & Studies & Tests Ordered   There are no Patient Instructions on file for this visit.    Marykay Lex, MD, MS Bryan Lemma, M.D., M.S. Interventional Cardiologist  [facility]  Pager # [phone] Phone # [phone] [address] [place], [place] [address]   Thank you for choosing [facility] [place]!!

## 2022-09-13 NOTE — H&P (View-Only) (Signed)
Primary Care Provider: Cletis Athens, MD [place] Cardiologist: Glenetta Hew, MD Electrophysiologist: None  Clinic Note: Chief Complaint  Patient presents with   New Patient (Initial Visit)    Abnormal treadmill stress test with symptomatic PVCs   ===================================  ASSESSMENT/PLAN   Problem List Items Addressed This Visit       Cardiology Problems   Atypical angina - Primary    We talked about ways to evaluate his symptoms, the recommendation was Coronary CTA.  The indication is that he continue to have PVCs throughout exercise and is having symptoms that seems somewhat consistent with angina albeit may be stable angina.  He himself says he is just more fatigued, but certainly worried that something is wrong.  We talked about options Coronary CTA or nuclear stress test versus cardiac catheterization.  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  He does not think that he could handle even the Myoview camera.  He certainly would not be to tolerate a CT scan having not been on do so in the past.  I explained what a heart catheterization would entail and that this would really be associate with some conscious sedation which I cannot do for either Myoview or significantly with any safety doing CT scan either.  After prolonged discussion given his concern we decided to proceed with invasive evaluation.  Certainly upper chest-throat tightness with exertion is potentially consistent with no typical anginal presentation.  Relatively new symptoms but seem to be more prominent now.  Also associate with symptomatic PVCs.  After long discussion as noted above with shared decision making, the patient has voiced interest to proceed with cardiac catheterization for definitive evaluation.  This allows him to be sedated and to have any abnormalities treated in one  setting.  Plan: We will schedule him for left heart catheterization with coronary angiography and possible percutaneous coronary mention for 09/20/2022. He says he did start taking aspirin 81 mg daily.  I just reassured that he was. I did indicate that he should continue taking the beta-blocker and calcium channel blocker as directed.   He is on rosuvastatin which depending on what we see in the Cath Lab will probably need to be titrated up based on his recent labs.  Shared Decision Making/Informed Consent The risks [stroke (1 in 1000), death (1 in 1000), kidney failure [usually temporary] (1 in 500), bleeding (1 in 200), allergic reaction [possibly serious] (1 in 200)], benefits (diagnostic support and management of coronary artery disease) and alternatives of a cardiac catheterization were discussed in detail with [name] and he is willing to proceed.'      Relevant Medications   aspirin EC 81 MG tablet   Other Relevant Orders   EKG 92-JJHE   Basic metabolic panel (Completed)   CBC (Completed)   Symptomatic PVCs    Holter monitor showed no evidence of VT and did not have a large number of PVCs, although they were symptomatic.  Echocardiogram was normal, but PVCs persisted throughout exercise on stress test which is somewhat concerning especially in light of him having atypical anginal symptoms.  Plan is for ischemic evaluation with cardiac catheterization.  Has not noted relief with the current dose of beta-blocker.  We do not have too much more room to titrate up however could consider other options based on results of cardiac catheterization.      Relevant Medications  aspirin EC 81 MG tablet   Other Relevant Orders   EKG 36-OQHU   Basic metabolic panel (Completed)   CBC (Completed)     Other   Abnormal ECG during exercise stress test    I do not have the actual EKGs for stress test, there did not appear to be any ischemic changes however he had PVCs that persisted throughout  exercise that were still symptomatic.  As such, there is definitely concern for ischemia.  See shared decision making and atypical angina section.  Plan for definitive evaluation with cardiac catheterization.      Dyslipidemia    Was started on rosuvastatin 10 mg based on recent labs.  LDL of 76 is not at goal for someone with CAD however with we will notify the severity of CAD by catheterization.  Could consider increasing rosuvastatin dose.      Relevant Orders   EKG 76-LYYT   Basic metabolic panel (Completed)   CBC (Completed)   Palpitation    Likely symptomatic PVCs based on description.  Thankfully monitor did not show a profound burden.  Echocardiogram was relatively normal.  We are proceeding with ischemic evaluation based on persistent PVCs during exercise.  No notable relief with current dose of beta-blocker although he has barely started it.  Not pressured to titrate further because of pulse of 64.      Relevant Orders   EKG 03-TWSF   Basic metabolic panel (Completed)   CBC (Completed)    ===================================  HPI:    [name] is a 76 y.o. male with PMH notable for HTN, HLD who is being seen today for the evaluation of Cardiovascular Evaluation at the request of Cletis Athens, MD.  Recent Hospitalizations:  [facility] ED 08/16/2022: Complaints of heart palpitations lasting 3 weeks but worse upon presentation.  Noted symptoms while trimming limbs off of a tree up on a ladder.  He noted his heart skipping a beat.  No associated dyspnea dizziness or chest pain.  The positive family history of heart disease (unknown).  2 cups of coffee with occasional beer or wine.  No chest pain.  Was hypertensive-164/84 mmHg.  EKG showed PVCs with left axis deviation. => Recommended OP cardiology evaluation.  Given prescription for Lopressor 25 mg twice daily  [name] was seen on 3 different days by Dr. Rebecka Apley following this ER visit. Noted that palpitations were probably  exacerbated by diet pills and alcohol.  Also anxiety.  No chest pain coughing or dyspnea.No syncope or near. => Holter monitor, echo and stress test ordered.  Reviewed  CV studies:    The following studies were reviewed today: (if available, images/films reviewed: From Epic Chart or Care Everywhere) 2D Echo 08/22/2022: Normal LV size and function.  EF 55%.  No WMA.  No effusion.  Normal valves. Treadmill stress test 08/23/2022: Exercised for 10 minutes.  Stopped due to dyspnea and tiredness/fatigue.  No chest pain.  PVC/aberrant beats noted during stress test.  No anginal pain or EKG changes.  Referred for CT angiogram/referred to cardiology Holter monitor did not show VT: Heart rate range 42-104 bpm with an average of 61 bpm.  Rare PACs and PVCs.  Some bigeminy noted.  No sustained arrhythmias.  Interval History:   [name] presents now for interventional cardiology evaluation.  I reviewed the notes from Dr. Rebecka Apley along with the echocardiogram, Holter monitor and treadmill stress test.  The recommendation from Dr. Rebecka Apley was to consider Coronary CTA. [name] is  having unusual sensations he really feels these pounding sensations in his chest which is probably consistent with a PVCs however he is also noting fullness and tightness sensation in his upper chest that goes up into his throat.  He can get this with exertion.  He also will feel that sometimes when the palpitations are coming on.  He just feels a heart beating hard and when that sensation comes on of the tightness in the throat he may get some short of breath.  But he also notices just he is much more fatigued than he used to be.  He knows something is wrong.  He was able to go 10 minutes on the treadmill without having the tightness but he did have dyspnea and just fatigue in general.  He used to be under work out in the yard all day long without having any major issues but now he does note more easy fatigability and getting short of  breath easier.  CV Review of Symptoms (Summary): positive for - chest pain, dyspnea on exertion, palpitations, and easy fatigability negative for - edema, orthopnea, paroxysmal nocturnal dyspnea, rapid heart rate, shortness of breath, or syncope or near syncope, TIA/hours fugax, claudication   REVIEWED OF SYSTEMS   Review of Systems  Constitutional:  Positive for malaise/fatigue (Just easy fatigability). Negative for weight loss.  HENT:  Negative for congestion and nosebleeds.   Respiratory:  Negative for cough and shortness of breath.   Gastrointestinal:  Positive for heartburn. Negative for blood in stool and melena.  Genitourinary:  Negative for hematuria.  Musculoskeletal:  Negative for joint pain and myalgias.  Neurological:  Negative for dizziness and focal weakness.  Psychiatric/Behavioral: Negative.     I have reviewed and (if needed) personally updated the patient's problem list, medications, allergies, past medical and surgical history, social and family history.   PAST MEDICAL HISTORY   Past Medical History:  Diagnosis Date   Actinic keratosis    Allergy    Arrhythmia    New problem within past 3 months   Arthritis    Basal cell carcinoma 08/12/2020   L post lat base of neck    BCC (basal cell carcinoma of skin) 02/17/2021   L infra auricular, exc 03/02/21   History of colon polyps    Hyperlipidemia    Hypertension    Melanoma ([place]) 2009   left top of shoulder. Dr. Koleen Nimrod   Seasonal allergies    Squamous cell carcinoma of skin 11/02/2017   left temple anterior to sideburn. EDC 11/02/2017    PAST SURGICAL HISTORY   Past Surgical History:  Procedure Laterality Date   COLONOSCOPY     MELANOMA EXCISION  2009   POLYPECTOMY     TOTAL KNEE ARTHROPLASTY Right 2011   TOTAL KNEE ARTHROPLASTY Left     Immunization History  Administered Date(s) Administered   Fluad Quad(high Dose 65+) 08/10/2021    MEDICATIONS/ALLERGIES   Current Meds  Medication Sig    amLODipine (NORVASC) 5 MG tablet TAKE 1 TABLET BY MOUTH EVERY DAY   aspirin EC 81 MG tablet Take 1 tablet (81 mg total) by mouth daily. Swallow whole.   losartan (COZAAR) 100 MG tablet Take 1 tablet (100 mg total) by mouth daily.   metoprolol tartrate (LOPRESSOR) 25 MG tablet Take 1 tablet (25 mg total) by mouth 2 (two) times daily.   Milk Thistle 250 MG CAPS Take 500 mg by mouth 2 (two) times daily.   Multiple Vitamins-Minerals (CENTRUM SILVER PO) Take  1 tablet by mouth daily.   Psyllium (METAMUCIL PO) Take 2 scoop by mouth at bedtime.   rosuvastatin (CRESTOR) 10 MG tablet TAKE 1 TABLET BY MOUTH EVERY DAY   [DISCONTINUED] Omega-3 Fatty Acids (FISH OIL) 1000 MG CAPS Take by mouth.    Allergies  Allergen Reactions   Atorvastatin Other (See Comments)    Muscle pain    SOCIAL HISTORY/FAMILY HISTORY   Reviewed in Epic:   Social History   Tobacco Use   Smoking status: Former    Packs/day: 0.25    Years: 10.00    Total pack years: 2.50    Types: Cigarettes    Quit date: 10/24/1972    Years since quitting: 49.9   Smokeless tobacco: Former    Quit date: 10/24/2002  Substance Use Topics   Alcohol use: Not Currently    Alcohol/week: 3.0 standard drinks of alcohol    Types: 3 Glasses of wine per week    Comment: Notcurrently   Drug use: No   Social History   Social History Narrative   Not on file   Family History  Problem Relation Age of Onset   Colon cancer Mother        died age 15-dx'd late 7-0's   Hypertension Mother    Arrhythmia Father        Died of heart attack when he was 22   Heart attack Father    Arrhythmia Paternal Uncle        Deceased with heart disease   Heart attack Paternal Uncle    Heart disease Paternal Uncle    Esophageal cancer Neg Hx    Rectal cancer Neg Hx    Stomach cancer Neg Hx    Crohn's disease Neg Hx    Father had Afib - but died sudden cardiac arrest Paternal Uncle & Multiple paternal cousins with CAD    OBJCTIVE -PE, EKG, labs   Wt  Readings from Last 3 Encounters:  09/14/22 205 lb (93 kg)  08/24/22 206 lb 14.4 oz (93.8 kg)  08/17/22 207 lb 1.6 oz (93.9 kg)    Physical Exam: BP 132/68 (BP Location: Left Arm, Patient Position: Sitting, Cuff Size: Large)   Pulse 64   Ht 6' (1.829 m)   Wt 205 lb (93 kg)   SpO2 95%   BMI 27.80 kg/m  Physical Exam Vitals reviewed.  Constitutional:      General: He is not in acute distress.    Appearance: Normal appearance. He is normal weight. He is not ill-appearing (Well-nourished, well-groomed.  Healthy-appearing.) or toxic-appearing.  HENT:     Head: Normocephalic and atraumatic.  Neck:     Vascular: No carotid bruit.  Cardiovascular:     Rate and Rhythm: Normal rate and regular rhythm.     Pulses: Normal pulses.     Heart sounds: Normal heart sounds. No murmur heard.    No friction rub. No gallop.  Pulmonary:     Effort: Pulmonary effort is normal. No respiratory distress.     Breath sounds: Normal breath sounds. No wheezing, rhonchi or rales.  Chest:     Chest wall: No tenderness.  Abdominal:     General: Abdomen is flat. Bowel sounds are normal. There is no distension.     Palpations: Abdomen is soft. There is no mass.     Tenderness: There is no abdominal tenderness. There is no guarding or rebound.     Hernia: No hernia is present.     Comments: No  HSM or bruit  Musculoskeletal:        General: No swelling. Normal range of motion.     Cervical back: Normal range of motion and neck supple.  Skin:    General: Skin is warm and dry.  Neurological:     General: No focal deficit present.     Mental Status: He is alert and oriented to person, place, and time.     Gait: Gait normal.  Psychiatric:        Mood and Affect: Mood normal.        Behavior: Behavior normal.        Thought Content: Thought content normal.        Judgment: Judgment normal.      Adult ECG Report  Rate: 64;  Rhythm: normal sinus rhythm, premature ventricular contractions (PVC), and  otherwise normal axis, intervals and durations. ;   Narrative Interpretation: Stable  Recent Labs:    Lab Results  Component Value Date   CHOL 162 08/18/2022   HDL 68 08/18/2022   LDLCALC 76 08/18/2022   TRIG 92 08/18/2022   CHOLHDL 2.4 08/18/2022   Lab Results  Component Value Date   CREATININE 1.02 09/14/2022   BUN 13 09/14/2022   NA 143 09/14/2022   K 4.4 09/14/2022   CL 104 09/14/2022   CO2 25 09/14/2022      Latest Ref Rng & Units 09/14/2022   12:08 PM 08/18/2022    3:46 PM 08/16/2022   12:36 PM  CBC  WBC 3.4 - 10.8 x10E3/uL 6.8  6.6  7.4   Hemoglobin 13.0 - 17.7 g/dL 14.6  15.3  14.4   Hematocrit 37.5 - 51.0 % 46.5  45.1  44.6   Platelets 150 - 450 x10E3/uL 246  279  247     Lab Results  Component Value Date   HGBA1C 5.3 08/04/2021   Lab Results  Component Value Date   TSH 0.92 08/18/2022    ================================================== I spent a total of 48 minutes with the patient spent in direct patient consultation.  Additional time spent with chart review  / charting (studies, outside notes, etc): 29 min Total Time: 77 min  Current medicines are reviewed at length with the patient today.  (+/- concerns) none  Notice: This dictation was prepared with Dragon dictation along with smart phrase technology. Any transcriptional errors that result from this process are unintentional and may not be corrected upon review.   Studies Ordered:  Orders Placed This Encounter  Procedures   Basic metabolic panel   CBC   EKG 12-Lead   Meds ordered this encounter  Medications   aspirin EC 81 MG tablet    Sig: Take 1 tablet (81 mg total) by mouth daily. Swallow whole.    Dispense:  90 tablet    Refill:  3    Patient Instructions / Medication Changes & Studies & Tests Ordered   Patient Instructions  Medication Instructions:    Start taking Aspirin 81 mg daily  *If you need a refill on your cardiac medications before your next appointment, please  call your pharmacy*   Lab Work: Bmp  cbc If you have labs (blood work) drawn today and your tests are completely normal, you will receive your results only by: MyChart Message (if you have MyChart) OR A paper copy in the mail If you have any lab test that is abnormal or we need to change your treatment, we will call you to review the results.  Testing/Procedures: Will be schedule at [place] has requested that you have a cardiac catheterization. Cardiac catheterization is used to diagnose and/or treat various heart conditions. Doctors may recommend this procedure for a number of different reasons. The most common reason is to evaluate chest pain. Chest pain can be a symptom of coronary artery disease (CAD), and cardiac catheterization can show whether plaque is narrowing or blocking your heart's arteries. This procedure is also used to evaluate the valves, as well as measure the blood flow and oxygen levels in different parts of your heart. Please follow instruction sheet, as given.     Follow-Up: At [facility], you and your health needs are our priority.  As part of our continuing mission to provide you with exceptional heart care, we have created designated Provider Care Teams.  These Care Teams include your primary Cardiologist (physician) and Advanced Practice Providers (APPs -  Physician Assistants and Nurse Practitioners) who all work together to provide you with the care you need, when you need it.     Your next appointment:   4 week(s)  The format for your next appointment:   In Person  Provider:   Glenetta Hew, MD    Other Instructions   [place] A DEPT OF [place] A DEPT OF [facility] [place] [id number] [place] [address] Dept: [phone] Loc: [place]  09/14/2022  You are scheduled  for a Cardiac Catheterization on Tuesday, November 28 with Dr. Glenetta Hew.  1. Please arrive at the [facility] (Main Entrance A) at [facility]: [address] [place], [place] [address] at 7:00 AM (This time is two hours before your procedure to ensure your preparation). Free valet parking service is available.   Special note: Every effort is made to have your procedure done on time. Please understand that emergencies sometimes delay scheduled procedures.  2. Diet: Do not eat solid foods after midnight.  The patient may have clear liquids until 5am upon the day of the procedure.  3. Labs: You will need to have blood drawn today   - BMP. CBC ,  You do not need to be fasting.  4. Medication instructions in preparation for your procedure:   Contrast Allergy: No   Stop taking, Cozaar (Losartan) Tuesday, November 28, ( that day Only)     On the morning of your procedure, take your Aspirin 81 mg and any morning medicines NOT listed above.  You may use sips of water.  5. Plan for one night stay--bring personal belongings. 6. Bring a current list of your medications and current insurance cards. 7. You MUST have a responsible person to drive you home. 8. Someone MUST be with you the first 24 hours after you arrive home or your discharge will be delayed. 9. Please wear clothes that are easy to get on and off and wear slip-on shoes.  Thank you for allowing [place] to care for you!   -- [place] [facility]     Leonie Man, MD, MS Glenetta Hew, M.D., M.S. Interventional Cardiologist  [place]  Pager # [phone] Phone # [phone] [address] [place],  [address]   Thank you for choosing [place] at [place]!!

## 2022-09-14 ENCOUNTER — Ambulatory Visit: Payer: PPO | Attending: Cardiovascular Disease | Admitting: Cardiology

## 2022-09-14 ENCOUNTER — Encounter: Payer: Self-pay | Admitting: Cardiology

## 2022-09-14 VITALS — BP 132/68 | HR 64 | Ht 72.0 in | Wt 205.0 lb

## 2022-09-14 DIAGNOSIS — R002 Palpitations: Secondary | ICD-10-CM | POA: Diagnosis not present

## 2022-09-14 DIAGNOSIS — R9431 Abnormal electrocardiogram [ECG] [EKG]: Secondary | ICD-10-CM | POA: Diagnosis not present

## 2022-09-14 DIAGNOSIS — I2089 Other forms of angina pectoris: Secondary | ICD-10-CM | POA: Diagnosis not present

## 2022-09-14 DIAGNOSIS — R079 Chest pain, unspecified: Secondary | ICD-10-CM | POA: Diagnosis not present

## 2022-09-14 DIAGNOSIS — R9439 Abnormal result of other cardiovascular function study: Secondary | ICD-10-CM

## 2022-09-14 DIAGNOSIS — I493 Ventricular premature depolarization: Secondary | ICD-10-CM | POA: Insufficient documentation

## 2022-09-14 DIAGNOSIS — E785 Hyperlipidemia, unspecified: Secondary | ICD-10-CM | POA: Diagnosis not present

## 2022-09-14 MED ORDER — ASPIRIN 81 MG PO TBEC: 81.0000 mg | DELAYED_RELEASE_TABLET | Freq: Every day | ORAL | 3 refills | Status: AC

## 2022-09-14 NOTE — Patient Instructions (Signed)
Medication Instructions:    Start taking Aspirin 81 mg daily  *If you need a refill on your cardiac medications before your next appointment, please call your pharmacy*   Lab Work: Bmp  cbc If you have labs (blood work) drawn today and your tests are completely normal, you will receive your results only by: [place] (if you have MyChart) OR A paper copy in the mail If you have any lab test that is abnormal or we need to change your treatment, we will call you to review the results.   Testing/Procedures: Will be schedule at [place] has requested that you have a cardiac catheterization. Cardiac catheterization is used to diagnose and/or treat various heart conditions. Doctors may recommend this procedure for a number of different reasons. The most common reason is to evaluate chest pain. Chest pain can be a symptom of coronary artery disease (CAD), and cardiac catheterization can show whether plaque is narrowing or blocking your heart's arteries. This procedure is also used to evaluate the valves, as well as measure the blood flow and oxygen levels in different parts of your heart. Please follow instruction sheet, as given.     Follow-Up: At [facility], you and your health needs are our priority.  As part of our continuing mission to provide you with exceptional heart care, we have created designated Provider Care Teams.  These Care Teams include your primary Cardiologist (physician) and Advanced Practice Providers (APPs -  Physician Assistants and Nurse Practitioners) who all work together to provide you with the care you need, when you need it.     Your next appointment:   4 week(s)  The format for your next appointment:   In Person  Provider:   Glenetta Hew, MD    Other Instructions   [place] A DEPT OF [place] A DEPT OF . [facility] [place] [id number] [place] [address] Dept: [phone] Loc: [place]  09/14/2022  You are scheduled for a Cardiac Catheterization on Tuesday, November 28 with Dr. Glenetta Hew.  1. Please arrive at the [facility] (Main Entrance A) at [facility]: [address] [place], [place] [address] at 7:00 AM (This time is two hours before your procedure to ensure your preparation). Free valet parking service is available.   Special note: Every effort is made to have your procedure done on time. Please understand that emergencies sometimes delay scheduled procedures.  2. Diet: Do not eat solid foods after midnight.  The patient may have clear liquids until 5am upon the day of the procedure.  3. Labs: You will need to have blood drawn today   - BMP. CBC ,  You do not need to be fasting.  4. Medication instructions in preparation for your procedure:   Contrast Allergy: No   Stop taking, Cozaar (Losartan) Tuesday, November 28, ( that day Only)     On the morning of your procedure, take your Aspirin 81 mg and any morning medicines NOT listed above.  You may use sips of water.  5. Plan for one night stay--bring personal belongings. 6. Bring a current list of your medications and current insurance cards. 7. You MUST have a responsible person to drive you home. 8. Someone MUST be with you the first 24 hours after you arrive home or your discharge will be  delayed. 9. Please wear clothes that are easy to get on and off and wear slip-on shoes.  Thank you for allowing [place] to care for you!   -- [place] [facility]

## 2022-09-15 ENCOUNTER — Encounter: Payer: Self-pay | Admitting: Cardiology

## 2022-09-15 DIAGNOSIS — R9431 Abnormal electrocardiogram [ECG] [EKG]: Secondary | ICD-10-CM | POA: Insufficient documentation

## 2022-09-15 LAB — CBC
Hematocrit: 46.5 % (ref 37.5–51.0)
Hemoglobin: 14.6 g/dL (ref 13.0–17.7)
MCH: 29.8 pg (ref 26.6–33.0)
MCHC: 31.4 g/dL — ABNORMAL LOW (ref 31.5–35.7)
MCV: 95 fL (ref 79–97)
Platelets: 246 10*3/uL (ref 150–450)
RBC: 4.9 x10E6/uL (ref 4.14–5.80)
RDW: 12.9 % (ref 11.6–15.4)
WBC: 6.8 10*3/uL (ref 3.4–10.8)

## 2022-09-15 LAB — BASIC METABOLIC PANEL
BUN/Creatinine Ratio: 13 (ref 10–24)
BUN: 13 mg/dL (ref 8–27)
CO2: 25 mmol/L (ref 20–29)
Calcium: 9.5 mg/dL (ref 8.6–10.2)
Chloride: 104 mmol/L (ref 96–106)
Creatinine, Ser: 1.02 mg/dL (ref 0.76–1.27)
Glucose: 98 mg/dL (ref 70–99)
Potassium: 4.4 mmol/L (ref 3.5–5.2)
Sodium: 143 mmol/L (ref 134–144)
eGFR: 77 mL/min/{1.73_m2} (ref >59)
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
## 2022-09-15 NOTE — Assessment & Plan Note (Signed)
I do not have the actual EKGs for stress test, there did not appear to be any ischemic changes however he had PVCs that persisted throughout exercise that were still symptomatic.  As such, there is definitely concern for ischemia.  See shared decision making and atypical angina section.  Plan for definitive evaluation with cardiac catheterization.

## 2022-09-15 NOTE — Assessment & Plan Note (Addendum)
Holter monitor showed no evidence of VT and did not have a large number of PVCs, although they were symptomatic.  Echocardiogram was normal, but PVCs persisted throughout exercise on stress test which is somewhat concerning especially in light of him having atypical anginal symptoms.  Plan is for ischemic evaluation with cardiac catheterization.  Has not noted relief with the current dose of beta-blocker.  We do not have too much more room to titrate up however could consider other options based on results of cardiac catheterization.

## 2022-09-15 NOTE — Assessment & Plan Note (Signed)
Was started on rosuvastatin 10 mg based on recent labs.  LDL of 76 is not at goal for someone with CAD however with we will notify the severity of CAD by catheterization.  Could consider increasing rosuvastatin dose.

## 2022-09-15 NOTE — Assessment & Plan Note (Addendum)
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  Thankfully monitor did not show a profound burden.  Echocardiogram was relatively normal.  We are proceeding with ischemic evaluation based on persistent PVCs during exercise.  No notable relief with current dose of beta-blocker although he has barely started it.  Not pressured to titrate further because of pulse of 64.

## 2022-09-17 ENCOUNTER — Encounter: Payer: Self-pay | Admitting: Internal Medicine

## 2022-09-19 ENCOUNTER — Encounter: Payer: Self-pay | Admitting: Cardiovascular Disease

## 2022-09-19 ENCOUNTER — Telehealth: Payer: Self-pay | Admitting: *Deleted

## 2022-09-19 NOTE — Telephone Encounter (Signed)
Cardiac Catheterization scheduled at [facility] for: Tuesday September 20, 2022 9 AM Arrival time and place: [facility] Main Entrance A at: 7 AM  Nothing to eat after midnight prior to procedure, clear liquids until 5 AM day of procedure.  Medication instructions: -Usual morning medications can be taken with sips of water including aspirin 81 mg.  Confirmed patient has responsible adult to drive home post procedure and be with patient first 24 hours after arriving home.  Patient reports no new symptoms concerning for COVID-19 in the past 10 days.  Reviewed procedure instructions with patient.

## 2022-09-20 ENCOUNTER — Other Ambulatory Visit: Payer: Self-pay

## 2022-09-20 ENCOUNTER — Encounter (HOSPITAL_COMMUNITY)
Admission: RE | Disposition: A | Discharge status: Home / Self Care | Payer: Self-pay | Source: Home / Self Care | Attending: Cardiology

## 2022-09-20 ENCOUNTER — Ambulatory Visit (HOSPITAL_COMMUNITY)
Admission: RE | Admit: 2022-09-20 | Discharge: 2022-09-20 | Disposition: A | Discharge status: Home / Self Care | Payer: PPO | Attending: Cardiology | Admitting: Cardiology

## 2022-09-20 ENCOUNTER — Encounter (HOSPITAL_COMMUNITY): Payer: Self-pay | Admitting: Cardiology

## 2022-09-20 DIAGNOSIS — I2089 Other forms of angina pectoris: Secondary | ICD-10-CM | POA: Diagnosis not present

## 2022-09-20 DIAGNOSIS — Z87891 Personal history of nicotine dependence: Secondary | ICD-10-CM | POA: Diagnosis not present

## 2022-09-20 DIAGNOSIS — Z7982 Long term (current) use of aspirin: Secondary | ICD-10-CM | POA: Insufficient documentation

## 2022-09-20 DIAGNOSIS — I493 Ventricular premature depolarization: Secondary | ICD-10-CM | POA: Insufficient documentation

## 2022-09-20 DIAGNOSIS — R9431 Abnormal electrocardiogram [ECG] [EKG]: Secondary | ICD-10-CM

## 2022-09-20 DIAGNOSIS — E785 Hyperlipidemia, unspecified: Secondary | ICD-10-CM | POA: Insufficient documentation

## 2022-09-20 DIAGNOSIS — I10 Essential (primary) hypertension: Secondary | ICD-10-CM | POA: Diagnosis not present

## 2022-09-20 DIAGNOSIS — R9439 Abnormal result of other cardiovascular function study: Secondary | ICD-10-CM | POA: Diagnosis not present

## 2022-09-20 HISTORY — PX: LEFT HEART CATH AND CORONARY ANGIOGRAPHY: CATH118249

## 2022-09-20 SURGERY — LEFT HEART CATH AND CORONARY ANGIOGRAPHY
Anesthesia: LOCAL

## 2022-09-20 MED ORDER — VERAPAMIL HCL 2.5 MG/ML IV SOLN
INTRAVENOUS | Status: DC | PRN
  Administered 2022-09-20: 10 mL via INTRA_ARTERIAL

## 2022-09-20 MED ORDER — LIDOCAINE HCL (PF) 1 % IJ SOLN
INTRAMUSCULAR | Status: AC
  Filled 2022-09-20: qty 30

## 2022-09-20 MED ORDER — ONDANSETRON HCL 4 MG/2ML IJ SOLN: 4.0000 mg | Freq: Four times a day (QID) | INTRAMUSCULAR | Status: DC | PRN

## 2022-09-20 MED ORDER — IOHEXOL 350 MG/ML SOLN
INTRAVENOUS | Status: DC | PRN
  Administered 2022-09-20: 45 mL

## 2022-09-20 MED ORDER — HEPARIN (PORCINE) IN NACL 1000-0.9 UT/500ML-% IV SOLN
INTRAVENOUS | Status: DC | PRN
  Administered 2022-09-20 (×2): 500 mL

## 2022-09-20 MED ORDER — LABETALOL HCL 5 MG/ML IV SOLN: 10.0000 mg | INTRAVENOUS | Status: DC | PRN

## 2022-09-20 MED ORDER — SODIUM CHLORIDE 0.9% FLUSH: 3.0000 mL | INTRAVENOUS | Status: DC | PRN

## 2022-09-20 MED ORDER — FENTANYL CITRATE (PF) 100 MCG/2ML IJ SOLN
INTRAMUSCULAR | Status: AC
  Filled 2022-09-20: qty 2

## 2022-09-20 MED ORDER — HEPARIN SODIUM (PORCINE) 1000 UNIT/ML IJ SOLN
INTRAMUSCULAR | Status: AC
  Filled 2022-09-20: qty 10

## 2022-09-20 MED ORDER — SODIUM CHLORIDE 0.9% FLUSH: 3.0000 mL | Freq: Two times a day (BID) | INTRAVENOUS | Status: DC

## 2022-09-20 MED ORDER — SODIUM CHLORIDE 0.9 % WEIGHT BASED INFUSION
3.0000 mL/kg/h | INTRAVENOUS | Status: AC
  Administered 2022-09-20: 3 mL/kg/h via INTRAVENOUS

## 2022-09-20 MED ORDER — LIDOCAINE HCL (PF) 1 % IJ SOLN
INTRAMUSCULAR | Status: DC | PRN
  Administered 2022-09-20: 2 mL

## 2022-09-20 MED ORDER — SODIUM CHLORIDE 0.9 % WEIGHT BASED INFUSION: 1.0000 mL/kg/h | INTRAVENOUS | Status: DC

## 2022-09-20 MED ORDER — MIDAZOLAM HCL 2 MG/2ML IJ SOLN
INTRAMUSCULAR | Status: AC
  Filled 2022-09-20: qty 2

## 2022-09-20 MED ORDER — METOPROLOL TARTRATE 25 MG PO TABS: 37.5000 mg | ORAL_TABLET | Freq: Two times a day (BID) | ORAL | 1 refills | Status: DC

## 2022-09-20 MED ORDER — HEPARIN (PORCINE) IN NACL 1000-0.9 UT/500ML-% IV SOLN
INTRAVENOUS | Status: AC
  Filled 2022-09-20: qty 1000

## 2022-09-20 MED ORDER — VERAPAMIL HCL 2.5 MG/ML IV SOLN
INTRAVENOUS | Status: AC
  Filled 2022-09-20: qty 2

## 2022-09-20 MED ORDER — SODIUM CHLORIDE 0.9 % IV SOLN: 250.0000 mL | INTRAVENOUS | Status: DC | PRN

## 2022-09-20 MED ORDER — HYDRALAZINE HCL 20 MG/ML IJ SOLN: 10.0000 mg | INTRAMUSCULAR | Status: DC | PRN

## 2022-09-20 MED ORDER — HEPARIN SODIUM (PORCINE) 1000 UNIT/ML IJ SOLN
INTRAMUSCULAR | Status: DC | PRN
  Administered 2022-09-20: 5000 [IU] via INTRAVENOUS

## 2022-09-20 MED ORDER — FENTANYL CITRATE (PF) 100 MCG/2ML IJ SOLN
INTRAMUSCULAR | Status: DC | PRN
  Administered 2022-09-20: 25 ug via INTRAVENOUS

## 2022-09-20 MED ORDER — ACETAMINOPHEN 325 MG PO TABS: 650.0000 mg | ORAL_TABLET | ORAL | Status: DC | PRN

## 2022-09-20 MED ORDER — MIDAZOLAM HCL 2 MG/2ML IJ SOLN
INTRAMUSCULAR | Status: DC | PRN
  Administered 2022-09-20: 1 mg via INTRAVENOUS

## 2022-09-20 SURGICAL SUPPLY — 12 items
CATH INFINITI 5FR ANG PIGTAIL (CATHETERS) IMPLANT
CATH INFINITI MULTIPACK ANG 4F (CATHETERS) IMPLANT
CATH OPTITORQUE TIG 4.0 5F (CATHETERS) IMPLANT
DEVICE RAD COMP TR BAND LRG (VASCULAR PRODUCTS) IMPLANT
GLIDESHEATH SLEND SS 6F .021 (SHEATH) IMPLANT
GUIDEWIRE INQWIRE 1.5J.035X260 (WIRE) IMPLANT
INQWIRE 1.5J .035X260CM (WIRE) ×1
KIT HEART LEFT (KITS) ×1 IMPLANT
PACK CARDIAC CATHETERIZATION (CUSTOM PROCEDURE TRAY) ×1 IMPLANT
SHEATH PROBE COVER 6X72 (BAG) IMPLANT
TRANSDUCER W/STOPCOCK (MISCELLANEOUS) ×1 IMPLANT
TUBING CIL FLEX 10 FLL-RA (TUBING) ×1 IMPLANT

## 2022-09-20 NOTE — Progress Notes (Signed)
TR band removed, new dressing and brace in place to arm. No bleeding or hematoma noted to site. Will continue to monitor.

## 2022-09-20 NOTE — Interval H&P Note (Signed)
History and Physical Interval Note:  09/20/2022 7:22 AM  [name]  has presented today for surgery, with the diagnosis of aytipical angina , abnormal stress test.    The various methods of treatment have been discussed with the patient and family. After consideration of risks, benefits and other options for treatment, the patient has consented to  Procedure(s): LEFT HEART CATH AND CORONARY ANGIOGRAPHY (N/A)  PERCUTANEOUS CORONARY INTERVENTION  as a surgical intervention.  The patient's history has been reviewed, patient examined, no change in status, stable for surgery.  I have reviewed the patient's chart and labs.  Questions were answered to the patient's satisfaction.    Cath Lab Visit (complete for each Cath Lab visit)  Clinical Evaluation Leading to the Procedure:   ACS: No.  Non-ACS:    Anginal Classification: CCS III  Anti-ischemic medical therapy: Minimal Therapy (1 class of medications)  Non-Invasive Test Results: Equivocal test results - Non-ischemic, but with persistent PVCs.  Prior CABG: No previous CABG   Glenetta Hew

## 2022-09-20 NOTE — Progress Notes (Signed)
Pt and wife received d/c instructions written and verbal. All questions and concerns addressed. Denies any acute pain or discomfort. Dressing to procedure site, no bleeding noted to site.

## 2022-09-28 ENCOUNTER — Ambulatory Visit: Payer: PPO | Attending: Cardiology | Admitting: Cardiology

## 2022-09-28 ENCOUNTER — Encounter: Payer: Self-pay | Admitting: Cardiology

## 2022-09-28 VITALS — BP 130/72 | HR 60 | Ht 72.0 in | Wt 206.2 lb

## 2022-09-28 DIAGNOSIS — I493 Ventricular premature depolarization: Secondary | ICD-10-CM | POA: Diagnosis not present

## 2022-09-28 DIAGNOSIS — R9431 Abnormal electrocardiogram [ECG] [EKG]: Secondary | ICD-10-CM | POA: Diagnosis not present

## 2022-09-28 DIAGNOSIS — E785 Hyperlipidemia, unspecified: Secondary | ICD-10-CM

## 2022-09-28 DIAGNOSIS — R079 Chest pain, unspecified: Secondary | ICD-10-CM

## 2022-09-28 MED ORDER — METOPROLOL TARTRATE 25 MG PO TABS: 25.0000 mg | ORAL_TABLET | Freq: Two times a day (BID) | ORAL | 1 refills | Status: DC

## 2022-09-28 NOTE — Progress Notes (Signed)
Primary Care Provider: Cletis Athens, MD [place] Cardiologist: Glenetta Hew, MD Electrophysiologist: None  Clinic Note: No chief complaint on file.  ===================================  ASSESSMENT/PLAN   Problem List Items Addressed This Visit   None   ===================================  HPI:    [name] is a 76 y.o. male with a PMH below who presents today for post cath Follow-up.   [name] was seen for initial consultation on 09/12/2022 for evaluation of abnormal GXT with frequent PVCs, and symptoms concerning for atypical angina at the request of Cletis Athens, MD.  Recent Hospitalizations:  Cardiac Cath 09/20/2022:   Reviewed  CV studies:    The following studies were reviewed today: (if available, images/films reviewed: From Epic Chart or Care Everywhere) Cath 09/20/2022: Angiographically Normal Coronary Arteries -- Right Dominant Tortuous Innominate Artery -- For Future Catheterizations, recommend Femoral or Left Radial Approach   Interval History:   [name]   CV Review of Symptoms (Summary): Cardiovascular ROS: {roscv:310661}  REVIEWED OF SYSTEMS   ROS  I have reviewed and (if needed) personally updated the patient's problem list, medications, allergies, past medical and surgical history, social and family history.   PAST MEDICAL HISTORY   Past Medical History:  Diagnosis Date  . Actinic keratosis   . Allergy   . Arrhythmia    New problem within past 3 months  . Arthritis   . Basal cell carcinoma 08/12/2020   L post lat base of neck   . BCC (basal cell carcinoma of skin) 02/17/2021   L infra auricular, exc 03/02/21  . History of colon polyps   . Hyperlipidemia   . Hypertension   . Melanoma ([place]) 2009   left top of shoulder. Dr. Koleen Nimrod  . Seasonal allergies   . 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 [facility] 11/02/2017    PAST SURGICAL HISTORY   Past Surgical History:   Procedure Laterality Date  . COLONOSCOPY    . LEFT HEART CATH AND CORONARY ANGIOGRAPHY N/A 09/20/2022   Procedure: LEFT HEART CATH AND CORONARY ANGIOGRAPHY;  Surgeon: Leonie Man, MD;  Location: [facility];  Service: Cardiovascular;  Laterality: N/A;  . MELANOMA EXCISION  2009  . POLYPECTOMY    . TOTAL KNEE ARTHROPLASTY Right 2011  . TOTAL KNEE ARTHROPLASTY Left     Immunization History  Administered Date(s) Administered  . Fluad Quad(high Dose 65+) 08/10/2021    MEDICATIONS/ALLERGIES   Current Meds  Medication Sig  . amLODipine (NORVASC) 5 MG tablet TAKE 1 TABLET BY MOUTH EVERY DAY  . amoxicillin (AMOXIL) 500 MG tablet Take 2,000 mg by mouth once. 1 hour prior to Dental procedure  . aspirin EC 81 MG tablet Take 1 tablet (81 mg total) by mouth daily. Swallow whole.  . losartan (COZAAR) 100 MG tablet Take 1 tablet (100 mg total) by mouth daily.  . metoprolol tartrate (LOPRESSOR) 25 MG tablet Take 1.5 tablets (37.5 mg total) by mouth 2 (two) times daily.  . Milk Thistle 250 MG CAPS Take 500 mg by mouth 2 (two) times daily.  . Multiple Vitamins-Minerals (CENTRUM SILVER PO) Take 1 tablet by mouth daily.  . Psyllium (METAMUCIL PO) Take 2 scoop by mouth at bedtime.  . rosuvastatin (CRESTOR) 10 MG tablet TAKE 1 TABLET BY MOUTH EVERY DAY    Allergies  Allergen Reactions  . Atorvastatin Other (See Comments)    Muscle pain    SOCIAL HISTORY/FAMILY HISTORY  Reviewed in Epic:  Pertinent findings:  Social History   Tobacco Use  . Smoking status: Former    Packs/day: 0.25    Years: 10.00    Total pack years: 2.50    Types: Cigarettes    Quit date: 10/24/1972    Years since quitting: 49.9  . Smokeless tobacco: Former    Quit date: 10/24/2002  Substance Use Topics  . Alcohol use: Not Currently    Alcohol/week: 3.0 standard drinks of alcohol    Types: 3 Glasses of wine per week    Comment: Notcurrently  . Drug use: No   Social History   Social History Narrative   . Not on file    OBJCTIVE -PE, EKG, labs   Wt Readings from Last 3 Encounters:  09/28/22 206 lb 3.2 oz (93.5 kg)  09/20/22 205 lb (93 kg)  09/14/22 205 lb (93 kg)    Physical Exam: BP 130/72   Pulse 60   Ht 6' (1.829 m)   Wt 206 lb 3.2 oz (93.5 kg)   SpO2 98%   BMI 27.97 kg/m  Physical Exam   Adult ECG Report  Rate: *** ;  Rhythm: {rhythm:17366};   Narrative Interpretation: ***  Recent Labs:  ***  Lab Results  Component Value Date   CHOL 162 08/18/2022   HDL 68 08/18/2022   LDLCALC 76 08/18/2022   TRIG 92 08/18/2022   CHOLHDL 2.4 08/18/2022   Lab Results  Component Value Date   CREATININE 1.02 09/14/2022   BUN 13 09/14/2022   NA 143 09/14/2022   K 4.4 09/14/2022   CL 104 09/14/2022   CO2 25 09/14/2022      Latest Ref Rng & Units 09/14/2022   12:08 PM 08/18/2022    3:46 PM 08/16/2022   12:36 PM  CBC  WBC 3.4 - 10.8 x10E3/uL 6.8  6.6  7.4   Hemoglobin 13.0 - 17.7 g/dL 14.6  15.3  14.4   Hematocrit 37.5 - 51.0 % 46.5  45.1  44.6   Platelets 150 - 450 x10E3/uL 246  279  247     Lab Results  Component Value Date   HGBA1C 5.3 08/04/2021   Lab Results  Component Value Date   TSH 0.92 08/18/2022    ================================================== I spent a total of ***minutes with the patient spent in direct patient consultation.  Additional time spent with chart review  / charting (studies, outside notes, etc): *** min Total Time: *** min  Current medicines are reviewed at length with the patient today.  (+/- concerns) ***  Notice: This dictation was prepared with Dragon dictation along with smart phrase technology. Any transcriptional errors that result from this process are unintentional and may not be corrected upon review.  Studies Ordered:   No orders of the defined types were placed in this encounter.  No orders of the defined types were placed in this encounter.   Patient Instructions / Medication Changes & Studies & Tests Ordered    There are no Patient Instructions on file for this visit.     Leonie Man, MD, MS Glenetta Hew, M.D., M.S. Interventional Cardiologist  [place]  Pager # [phone] Phone # [phone] [address] [place], [place] [address]   Thank you for choosing [facility]!!

## 2022-09-28 NOTE — Patient Instructions (Signed)
Medication Instructions:    Continue taking Metoprolol 25 mg twice a day   *If you need a refill on your cardiac medications before your next appointment, please call your pharmacy*   Lab Work: Not needed I   Testing/Procedures:  Not needed  Follow-Up: At [facility], you and your health needs are our priority.  As part of our continuing mission to provide you with exceptional heart care, we have created designated Provider Care Teams.  These Care Teams include your primary Cardiologist (physician) and Advanced Practice Providers (APPs -  Physician Assistants and Nurse Practitioners) who all work together to provide you with the care you need, when you need it.     Your next appointment:   6 month(s)  The format for your next appointment:   In Person  Provider:   Glenetta Hew, MD

## 2022-09-29 ENCOUNTER — Encounter: Payer: Self-pay | Admitting: Cardiology

## 2022-09-29 NOTE — Assessment & Plan Note (Signed)
Clearly atypical chest pain, with no clear obvious evidence of ischemic CAD or valvular degeneration.Marland Kitchen

## 2022-09-29 NOTE — Assessment & Plan Note (Signed)
With moderate disease, recommend LDL below 70 as possible.  Was 57 on last check.  Was restarted on rosuvastatin 10 mg, if no improvement to follow-up complete consider increasing dose.  He states tolerating this okay.

## 2022-09-29 NOTE — Assessment & Plan Note (Addendum)
This is because of PVCs.  Is possible there is some type of ventricular anomaly, not likely to be significant at this point.

## 2022-09-29 NOTE — Assessment & Plan Note (Addendum)
No sustained episodes of PVCs on monitor.  Actually since his heart catheterization with very reassuring results have him in no berry part related to his anxiety.    He actually did not increase metoprolol dose, apparently had not heard.  Currently his heart rate is in the 60s on the medications.-Noted to titrate beta-blocker further.  Thankfully, not a very high burden of PVCs on monitor.  Doing better overall.  Will continue with low-dose lisinopril given extended infiltrate. Is on aspirin Jardiance

## 2022-10-27 DIAGNOSIS — M5442 Lumbago with sciatica, left side: Secondary | ICD-10-CM | POA: Diagnosis not present

## 2022-10-27 DIAGNOSIS — M461 Sacroiliitis, not elsewhere classified: Secondary | ICD-10-CM | POA: Diagnosis not present

## 2022-10-27 DIAGNOSIS — M9904 Segmental and somatic dysfunction of sacral region: Secondary | ICD-10-CM | POA: Diagnosis not present

## 2022-10-27 DIAGNOSIS — M9903 Segmental and somatic dysfunction of lumbar region: Secondary | ICD-10-CM | POA: Diagnosis not present

## 2022-11-01 ENCOUNTER — Ambulatory Visit: Payer: PPO | Admitting: Cardiovascular Disease

## 2022-11-01 DIAGNOSIS — M461 Sacroiliitis, not elsewhere classified: Secondary | ICD-10-CM | POA: Diagnosis not present

## 2022-11-01 DIAGNOSIS — M9904 Segmental and somatic dysfunction of sacral region: Secondary | ICD-10-CM | POA: Diagnosis not present

## 2022-11-01 DIAGNOSIS — M9903 Segmental and somatic dysfunction of lumbar region: Secondary | ICD-10-CM | POA: Diagnosis not present

## 2022-11-01 DIAGNOSIS — M5442 Lumbago with sciatica, left side: Secondary | ICD-10-CM | POA: Diagnosis not present

## 2022-11-03 ENCOUNTER — Other Ambulatory Visit: Payer: Self-pay | Admitting: Internal Medicine

## 2022-11-03 DIAGNOSIS — M5442 Lumbago with sciatica, left side: Secondary | ICD-10-CM | POA: Diagnosis not present

## 2022-11-03 DIAGNOSIS — N401 Enlarged prostate with lower urinary tract symptoms: Secondary | ICD-10-CM

## 2022-11-03 DIAGNOSIS — M9903 Segmental and somatic dysfunction of lumbar region: Secondary | ICD-10-CM | POA: Diagnosis not present

## 2022-11-03 DIAGNOSIS — M9904 Segmental and somatic dysfunction of sacral region: Secondary | ICD-10-CM | POA: Diagnosis not present

## 2022-11-03 DIAGNOSIS — M461 Sacroiliitis, not elsewhere classified: Secondary | ICD-10-CM | POA: Diagnosis not present

## 2022-11-07 DIAGNOSIS — M5442 Lumbago with sciatica, left side: Secondary | ICD-10-CM | POA: Diagnosis not present

## 2022-11-07 DIAGNOSIS — M9904 Segmental and somatic dysfunction of sacral region: Secondary | ICD-10-CM | POA: Diagnosis not present

## 2022-11-07 DIAGNOSIS — M461 Sacroiliitis, not elsewhere classified: Secondary | ICD-10-CM | POA: Diagnosis not present

## 2022-11-07 DIAGNOSIS — M9903 Segmental and somatic dysfunction of lumbar region: Secondary | ICD-10-CM | POA: Diagnosis not present

## 2022-11-10 DIAGNOSIS — Z6828 Body mass index (BMI) 28.0-28.9, adult: Secondary | ICD-10-CM | POA: Diagnosis not present

## 2022-11-10 DIAGNOSIS — J018 Other acute sinusitis: Secondary | ICD-10-CM | POA: Diagnosis not present

## 2022-11-10 DIAGNOSIS — I10 Essential (primary) hypertension: Secondary | ICD-10-CM | POA: Diagnosis not present

## 2022-12-28 ENCOUNTER — Ambulatory Visit: Payer: PPO | Admitting: Dermatology

## 2023-01-30 ENCOUNTER — Ambulatory Visit: Payer: PPO | Admitting: Dermatology

## 2023-01-30 ENCOUNTER — Encounter: Payer: Self-pay | Admitting: Dermatology

## 2023-01-30 VITALS — BP 127/77 | HR 82

## 2023-01-30 DIAGNOSIS — L570 Actinic keratosis: Secondary | ICD-10-CM | POA: Diagnosis not present

## 2023-01-30 DIAGNOSIS — L820 Inflamed seborrheic keratosis: Secondary | ICD-10-CM | POA: Diagnosis not present

## 2023-01-30 DIAGNOSIS — Z85820 Personal history of malignant melanoma of skin: Secondary | ICD-10-CM

## 2023-01-30 DIAGNOSIS — L821 Other seborrheic keratosis: Secondary | ICD-10-CM

## 2023-01-30 DIAGNOSIS — D692 Other nonthrombocytopenic purpura: Secondary | ICD-10-CM

## 2023-01-30 DIAGNOSIS — D1801 Hemangioma of skin and subcutaneous tissue: Secondary | ICD-10-CM

## 2023-01-30 DIAGNOSIS — Z1283 Encounter for screening for malignant neoplasm of skin: Secondary | ICD-10-CM | POA: Diagnosis not present

## 2023-01-30 DIAGNOSIS — D229 Melanocytic nevi, unspecified: Secondary | ICD-10-CM

## 2023-01-30 DIAGNOSIS — L578 Other skin changes due to chronic exposure to nonionizing radiation: Secondary | ICD-10-CM | POA: Diagnosis not present

## 2023-01-30 DIAGNOSIS — Z8589 Personal history of malignant neoplasm of other organs and systems: Secondary | ICD-10-CM

## 2023-01-30 DIAGNOSIS — Z85828 Personal history of other malignant neoplasm of skin: Secondary | ICD-10-CM

## 2023-01-30 DIAGNOSIS — Z86018 Personal history of other benign neoplasm: Secondary | ICD-10-CM

## 2023-01-30 DIAGNOSIS — L814 Other melanin hyperpigmentation: Secondary | ICD-10-CM

## 2023-01-30 NOTE — Progress Notes (Signed)
Follow-Up Visit   Subjective  [name] is a 77 y.o. male who presents for the following: Skin Cancer Screening and Upper Body Skin Exam. HxMM, HxBCCs 6 month recheck Aks. Face.  The patient presents for Upper Body Skin Exam (UBSE) for skin cancer screening and mole check. The patient has spots, moles and lesions to be evaluated, some may be new or changing and the patient has concerns that these could be cancer.    The following portions of the chart were reviewed this encounter and updated as appropriate: medications, allergies, medical history  Review of Systems:  No other skin or systemic complaints except as noted in HPI or Assessment and Plan.  Objective  Well appearing patient in no apparent distress; mood and affect are within normal limits.  All skin waist up examined. Relevant physical exam findings are noted in the Assessment and Plan.    Assessment & Plan    HISTORY OF MELANOMA. Top of left shoulder. 2009 - No evidence of recurrence today - No lymphadenopathy - Recommend regular full body skin exams - Recommend daily broad spectrum sunscreen SPF 30+ to sun-exposed areas, reapply every 2 hours as needed.  - Call if any new or changing lesions are noted between office visits   HISTORY OF BASAL CELL CARCINOMA OF THE SKIN. 2021 left post lat base of neck , left infra auricular excision 02/2021  - No evidence of recurrence today - Recommend regular full body skin exams - Recommend daily broad spectrum sunscreen SPF 30+ to sun-exposed areas, reapply every 2 hours as needed.  - Call if any new or changing lesions are noted between office visits   History of Squamous Cell Carcinoma of the Skin 10/2017 left temple anterior to sideburn ED&C 2019 - No evidence of recurrence today - No lymphadenopathy - Recommend regular full body skin exams - Recommend daily broad spectrum sunscreen SPF 30+ to sun-exposed areas, reapply every 2 hours as needed.  - Call if any new or  changing lesions are noted between office visits  Lentigines, Seborrheic Keratoses, Hemangiomas - Benign normal skin lesions - Benign-appearing - Call for any changes  Melanocytic Nevi - Tan-brown and/or pink-flesh-colored symmetric macules and papules - Benign appearing on exam today - Observation - Call clinic for new or changing moles - Recommend daily use of broad spectrum spf 30+ sunscreen to sun-exposed areas.   Actinic Damage - Chronic condition, secondary to cumulative UV/sun exposure - diffuse scaly erythematous macules with underlying dyspigmentation - Recommend daily broad spectrum sunscreen SPF 30+ to sun-exposed areas, reapply every 2 hours as needed.  - Staying in the shade or wearing long sleeves, sun glasses (UVA+UVB protection) and wide brim hats (4-inch brim around the entire circumference of the hat) are also recommended for sun protection.  - Call for new or changing lesions.  Skin cancer screening performed today.  Purpura - Chronic; persistent and recurrent.  Treatable, but not curable. - Violaceous macules and patches - Benign - Related to trauma, age, sun damage and/or use of blood thinners, chronic use of topical and/or oral steroids - Observe - Can use OTC arnica containing moisturizer such as Dermend Bruise Formula if desired - Call for worsening or other concerns  INFLAMED SEBORRHEIC KERATOSIS Exam: Erythematous keratotic or waxy stuck-on papule or plaque.  Symptomatic, irritating, patient would like treated.  Benign-appearing.  Call clinic for new or changing lesions.   Prior to procedure, discussed risks of blister formation, small wound, skin dyspigmentation, or rare scar  following treatment. Recommend Vaseline ointment to treated areas while healing.  Destruction Procedure Note Destruction method: cryotherapy   Informed consent: discussed and consent obtained   Lesion destroyed using liquid nitrogen: Yes   Outcome: patient tolerated procedure  well with no complications   Post-procedure details: wound care instructions given   Locations: face, torso, arms x17, left leg x1 # of Lesions Treated: 18  ACTINIC KERATOSIS Exam: Erythematous thin papules/macules with gritty scale  Actinic keratoses are precancerous spots that appear secondary to cumulative UV radiation exposure/sun exposure over time. They are chronic with expected duration over 1 year. A portion of actinic keratoses will progress to squamous cell carcinoma of the skin. It is not possible to reliably predict which spots will progress to skin cancer and so treatment is recommended to prevent development of skin cancer.  Recommend daily broad spectrum sunscreen SPF 30+ to sun-exposed areas, reapply every 2 hours as needed.  Recommend staying in the shade or wearing long sleeves, sun glasses (UVA+UVB protection) and wide brim hats (4-inch brim around the entire circumference of the hat). Call for new or changing lesions.  Treatment Plan:  Prior to procedure, discussed risks of blister formation, small wound, skin dyspigmentation, or rare scar following cryotherapy. Recommend Vaseline ointment to treated areas while healing.  Destruction Procedure Note Destruction method: cryotherapy   Informed consent: discussed and consent obtained   Lesion destroyed using liquid nitrogen: Yes   Outcome: patient tolerated procedure well with no complications   Post-procedure details: wound care instructions given   Locations: face and ears x4, hands x14 # of Lesions Treated: 18   Return in about 6 months (around 08/01/2023) for TBSE, HxMM, HxBCC, HxSCC.  I, Lawson Radar, CMA, am acting as scribe for Armida Sans, MD.   Documentation: I have reviewed the above documentation for accuracy and completeness, and I agree with the above.  Armida Sans, MD

## 2023-01-30 NOTE — Patient Instructions (Signed)
Cryotherapy Aftercare  Wash gently with soap and water everyday.   Apply Vaseline daily until healed.    Recommend daily broad spectrum sunscreen SPF 30+ to sun-exposed areas, reapply every 2 hours as needed. Call for new or changing lesions.  Staying in the shade or wearing long sleeves, sun glasses (UVA+UVB protection) and wide brim hats (4-inch brim around the entire circumference of the hat) are also recommended for sun protection.     Melanoma ABCDEs  Melanoma is the most dangerous type of skin cancer, and is the leading cause of death from skin disease.  You are more likely to develop melanoma if you: Have light-colored skin, light-colored eyes, or red or blond hair Spend a lot of time in the sun Tan regularly, either outdoors or in a tanning bed Have had blistering sunburns, especially during childhood Have a close family member who has had a melanoma Have atypical moles or large birthmarks  Early detection of melanoma is key since treatment is typically straightforward and cure rates are extremely high if we catch it early.   The first sign of melanoma is often a change in a mole or a new dark spot.  The ABCDE system is a way of remembering the signs of melanoma.  A for asymmetry:  The two halves do not match. B for border:  The edges of the growth are irregular. C for color:  A mixture of colors are present instead of an even brown color. D for diameter:  Melanomas are usually (but not always) greater than 6mm - the size of a pencil eraser. E for evolution:  The spot keeps changing in size, shape, and color.  Please check your skin once per month between visits. You can use a small mirror in front and a large mirror behind you to keep an eye on the back side or your body.   If you see any new or changing lesions before your next follow-up, please call to schedule a visit.  Please continue daily skin protection including broad spectrum sunscreen SPF 30+ to sun-exposed  areas, reapplying every 2 hours as needed when you're outdoors.   Staying in the shade or wearing long sleeves, sun glasses (UVA+UVB protection) and wide brim hats (4-inch brim around the entire circumference of the hat) are also recommended for sun protection.     Due to recent changes in healthcare laws, you may see results of your pathology and/or laboratory studies on MyChart before the doctors have had a chance to review them. We understand that in some cases there may be results that are confusing or concerning to you. Please understand that not all results are received at the same time and often the doctors may need to interpret multiple results in order to provide you with the best plan of care or course of treatment. Therefore, we ask that you please give us 2 business days to thoroughly review all your results before contacting the office for clarification. Should we see a critical lab result, you will be contacted sooner.   If You Need Anything After Your Visit  If you have any questions or concerns for your doctor, please call our main line at [phone] and press option 4 to reach your doctor's medical assistant. If no one answers, please leave a voicemail as directed and we will return your call as soon as possible. Messages left after 4 pm will be answered the following business day.   You may also send us a message via   MyChart. We typically respond to MyChart messages within 1-2 business days.  For prescription refills, please ask your pharmacy to contact our office. Our fax number is [phone].  If you have an urgent issue when the clinic is closed that cannot wait until the next business day, you can page your doctor at the number below.    Please note that while we do our best to be available for urgent issues outside of office hours, we are not available 24/7.   If you have an urgent issue and are unable to reach us, you may choose to seek medical care at your doctor's  office, retail clinic, urgent care center, or emergency room.  If you have a medical emergency, please immediately call 911 or go to the emergency department.  Pager Numbers  - Dr. Kowalski: [phone]  - Dr. Moye: [phone]  - Dr. Stewart: [phone]  In the event of inclement weather, please call our main line at [phone] for an update on the status of any delays or closures.  Dermatology Medication Tips: Please keep the boxes that topical medications come in in order to help keep track of the instructions about where and how to use these. Pharmacies typically print the medication instructions only on the boxes and not directly on the medication tubes.   If your medication is too expensive, please contact our office at [phone] option 4 or send us a message through MyChart.   We are unable to tell what your co-pay for medications will be in advance as this is different depending on your insurance coverage. However, we may be able to find a substitute medication at lower cost or fill out paperwork to get insurance to cover a needed medication.   If a prior authorization is required to get your medication covered by your insurance company, please allow us 1-2 business days to complete this process.  Drug prices often vary depending on where the prescription is filled and some pharmacies may offer cheaper prices.  The website www.goodrx.com contains coupons for medications through different pharmacies. The prices here do not account for what the cost may be with help from insurance (it may be cheaper with your insurance), but the website can give you the price if you did not use any insurance.  - You can print the associated coupon and take it with your prescription to the pharmacy.  - You may also stop by our office during regular business hours and pick up a GoodRx coupon card.  - If you need your prescription sent electronically to a different pharmacy, notify our office  through [facility] MyChart or by phone at [phone] option 4.     Si Usted Necesita Algo Despus de Su Visita  Tambin puede enviarnos un mensaje a travs de MyChart. Por lo general respondemos a los mensajes de MyChart en el transcurso de 1 a 2 das hbiles.  Para renovar recetas, por favor pida a su farmacia que se ponga en contacto con nuestra oficina. Nuestro nmero de fax es el [phone].  Si tiene un asunto urgente cuando la clnica est cerrada y que no puede esperar hasta el siguiente da hbil, puede llamar/localizar a su doctor(a) al nmero que aparece a continuacin.   Por favor, tenga en cuenta que aunque hacemos todo lo posible para estar disponibles para asuntos urgentes fuera del horario de oficina, no estamos disponibles las 24 horas del da, los 7 das de la semana.   Si tiene un problema urgente y no   puede comunicarse con nosotros, puede optar por buscar atencin mdica  en el consultorio de su doctor(a), en una clnica privada, en un centro de atencin urgente o en una sala de emergencias.  Si tiene una emergencia mdica, por favor llame inmediatamente al 911 o vaya a la sala de emergencias.  Nmeros de bper  - Dr. Kowalski: [phone]  - Dra. Moye: [phone]  - Dra. Stewart: [phone]  En caso de inclemencias del tiempo, por favor llame a nuestra lnea principal al [phone] para una actualizacin sobre el estado de cualquier retraso o cierre.  Consejos para la medicacin en dermatologa: Por favor, guarde las cajas en las que vienen los medicamentos de uso tpico para ayudarle a seguir las instrucciones sobre dnde y cmo usarlos. Las farmacias generalmente imprimen las instrucciones del medicamento slo en las cajas y no directamente en los tubos del medicamento.   Si su medicamento es muy caro, por favor, pngase en contacto con nuestra oficina llamando al [phone] y presione la opcin 4 o envenos un mensaje a travs de MyChart.   No  podemos decirle cul ser su copago por los medicamentos por adelantado ya que esto es diferente dependiendo de la cobertura de su seguro. Sin embargo, es posible que podamos encontrar un medicamento sustituto a menor costo o llenar un formulario para que el seguro cubra el medicamento que se considera necesario.   Si se requiere una autorizacin previa para que su compaa de seguros cubra su medicamento, por favor permtanos de 1 a 2 das hbiles para completar este proceso.  Los precios de los medicamentos varan con frecuencia dependiendo del lugar de dnde se surte la receta y alguna farmacias pueden ofrecer precios ms baratos.  El sitio web www.goodrx.com tiene cupones para medicamentos de diferentes farmacias. Los precios aqu no tienen en cuenta lo que podra costar con la ayuda del seguro (puede ser ms barato con su seguro), pero el sitio web puede darle el precio si no utiliz ningn seguro.  - Puede imprimir el cupn correspondiente y llevarlo con su receta a la farmacia.  - Tambin puede pasar por nuestra oficina durante el horario de atencin regular y recoger una tarjeta de cupones de GoodRx.  - Si necesita que su receta se enve electrnicamente a una farmacia diferente, informe a nuestra oficina a travs de MyChart de [place] o por telfono llamando al [phone] y presione la opcin 4.    

## 2023-02-08 ENCOUNTER — Encounter: Payer: Self-pay | Admitting: Dermatology

## 2023-02-14 ENCOUNTER — Encounter: Payer: Self-pay | Admitting: Cardiology

## 2023-02-14 NOTE — Telephone Encounter (Signed)
I would be fine if we wean him off of metoprolol to see how he does.  Maybe stop both and then reinitiate statin after a week.   Bryan Lemma, MD

## 2023-02-14 NOTE — Telephone Encounter (Signed)
Statins can sometimes take a few years to cause the myalgias.  If Dr. Herbie Baltimore is willing to d/c metoprolol and symptoms continue, then we would know for sure which medication.   Could also stop the rosuvastatin for a week or two.

## 2023-03-16 ENCOUNTER — Encounter: Payer: Self-pay | Admitting: Cardiology

## 2023-03-16 ENCOUNTER — Ambulatory Visit: Payer: PPO | Attending: Cardiology | Admitting: Cardiology

## 2023-03-16 VITALS — BP 130/70 | HR 78 | Ht 71.0 in | Wt 208.2 lb

## 2023-03-16 DIAGNOSIS — I493 Ventricular premature depolarization: Secondary | ICD-10-CM | POA: Diagnosis not present

## 2023-03-16 DIAGNOSIS — E785 Hyperlipidemia, unspecified: Secondary | ICD-10-CM | POA: Diagnosis not present

## 2023-03-16 DIAGNOSIS — R9431 Abnormal electrocardiogram [ECG] [EKG]: Secondary | ICD-10-CM | POA: Diagnosis not present

## 2023-03-16 MED ORDER — METOPROLOL TARTRATE 25 MG PO TABS: 25.0000 mg | ORAL_TABLET | Freq: Every evening | ORAL | 1 refills | Status: DC | PRN

## 2023-03-16 NOTE — Progress Notes (Signed)
Primary Care Provider: Dorothey Baseman, MD [facility] Cardiologist: Bryan Lemma, MD Electrophysiologist: None  Clinic Note: Chief Complaint  Patient presents with  . 6 month follow up     Patient c/o frequent PVC's, feels a pounding in neck & skipping heart beats in the evening with lying on his left side. Medications reviewed by the patient verbally.     ===================================  ASSESSMENT/PLAN   Problem List Items Addressed This Visit       Cardiology Problems   Symptomatic PVCs - Primary (Chronic)    He felt like he was noticing symptoms while on metoprolol.  We had him do a metoprolol and rosuvastatin holiday.  After 2 weeks being off both, the plan was continued to reassess his symptoms and then try to reinitiate the rosuvastatin.  If symptoms resolved then we would hold off on beta-blocker otherwise if symptoms recurred, the likelihood was that it was the rosuvastatin and not metoprolol.        Other   Dyslipidemia (Chronic)   Abnormal ECG during exercise stress test (Chronic)    Abnormal GXT because of frequent PVCs.  Thankfully, normal coronaries on cardiac catheterization.  In the future, would probably choose a different modality if the plan is to evaluate for ischemia. Otherwise, to assess PVC burden, GXT would be reasonable.      ===================================  HPI:    [name] is a 77 y.o. male with a PMH notable for HTN, HLD and Symptomatic PVCs who presents today for 66-month follow-up. He was originally referred by No ref. provider found.  [name] was seen for initial consult on September 14, 2022 for abnormal GXT with symptomatic PVCs.  We discussed options of coronary CTA versus cardiac catheterization for definitive evaluation.  We chose to proceed with cardiac catheterization which revealed essentially normal coronary arteries.  He was noted to have a history of intolerance of atorvastatin.  We planned to increase  his rosuvastatin dose.  Also suggested beta-blocker for PVCs.  He was seen on September 28, 2022 for post-cath follow-up => event monitor did not show a very high burden of PVCs.  Initiated low-dose metoprolol 25 mg twice daily titrating from 12.5 up to 25 mg twice daily.  Also continued rosuvastatin.  Recent Hospitalizations: None  Called in on February 14, 2023 -> indicated that he does not have any further flutters but was having lots of joint pain related to metoprolol.  Began shortly after he increased to 25 mg twice daily.  He decrease it to 12.5 mg twice daily and flutter is still stable.  But the joint pain persisted.  He wanted to stop the medication. => Recommendation was stopped rosuvastatin, reduce metoprolol to 12.5 mg once daily for a week and then stop.  Reassess for palpitations.  Also, reassess joint pain.  Decision was for continuation of medications based on results.  Reviewed  CV studies:    The following studies were reviewed today: (if available, images/films reviewed: From Epic Chart or Care Everywhere) No new studies:  Interval History:   [name]     CV Review of Symptoms (Summary): {roscv:310661}  REVIEWED OF SYSTEMS   ROS  I have reviewed and (if needed) personally updated the patient's problem list, medications, allergies, past medical and surgical history, social and family history.   PAST MEDICAL HISTORY   Past Medical History:  Diagnosis Date  . Actinic keratosis   . Allergy   . Arrhythmia    New  problem within past 3 months  . Arthritis   . Basal cell carcinoma 08/12/2020   L post lat base of neck   . BCC (basal cell carcinoma of skin) 02/17/2021   L infra auricular, exc 03/02/21  . History of colon polyps   . Hyperlipidemia   . Hypertension   . Melanoma (HCC) 2009   left top of shoulder. Dr. Orson Aloe  . Seasonal allergies   . 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 [facility] 11/02/2017    PAST SURGICAL  HISTORY   Past Surgical History:  Procedure Laterality Date  . COLONOSCOPY    . EXERCISE TREADMILL STRESS TEST  08/22/2022   Dr. Harl Bowie: Exercised for 10 minutes.  Stopped due to dyspnea and tiredness/fatigue.  No chest pain.  PVC/aberrant beats noted during stress test.  No anginal pain or EKG changes.  Marland Kitchen HOLTER MONITOR  07/2022   No evidence of VT.  HR range 42 over 104 bpm.  Average 61 bpm.  Rare PACs and PVCs.  Some bigeminy.  No sustained arrhythmias.  Marland Kitchen LEFT HEART CATH AND CORONARY ANGIOGRAPHY N/A 09/20/2022   Procedure: LEFT HEART CATH AND CORONARY ANGIOGRAPHY;  Surgeon: Marykay Lex, MD;  Location: [facility] INVASIVE CV LAB;  Service: Cardiovascular'; Minimal CAD -essentially angiographically normal right dominant system.  Due to tortuous innominate artery, recommend either leftr adial or femoral access if catheterization considered in the future.  Marland Kitchen MELANOMA EXCISION  2009  . POLYPECTOMY    . TOTAL KNEE ARTHROPLASTY Right 2011  . TOTAL KNEE ARTHROPLASTY Left   . TRANSTHORACIC ECHOCARDIOGRAM  08/22/2022   Normal LV size and function.  EF 55%.  No WMA.  No effusion.  Normal valves   Cardiac Cath 09/20/2022:Dominance: Right; essentially normal coronaries.  Tortuous innominate artery.  Difficult to engage via right radial.   MEDICATIONS/ALLERGIES   Current Meds  Medication Sig  . amLODipine (NORVASC) 5 MG tablet TAKE 1 TABLET BY MOUTH EVERY DAY  . aspirin EC 81 MG tablet Take 1 tablet (81 mg total) by mouth daily. Swallow whole.  . losartan (COZAAR) 100 MG tablet Take 1 tablet (100 mg total) by mouth daily.  . Milk Thistle 250 MG CAPS Take 500 mg by mouth 2 (two) times daily.  . Multiple Vitamins-Minerals (CENTRUM SILVER PO) Take 1 tablet by mouth daily.  . Psyllium (METAMUCIL PO) Take 2 scoop by mouth at bedtime.  . rosuvastatin (CRESTOR) 10 MG tablet TAKE 1 TABLET BY MOUTH EVERY DAY    Allergies  Allergen Reactions  . Atorvastatin Other (See Comments)    Muscle pain     SOCIAL HISTORY/FAMILY HISTORY   Reviewed in Epic:  Pertinent findings:  Social History   Tobacco Use  . Smoking status: Former    Packs/day: 0.25    Years: 10.00    Additional pack years: 0.00    Total pack years: 2.50    Types: Cigarettes    Quit date: 10/24/1972    Years since quitting: 50.4  . Smokeless tobacco: Former    Quit date: 10/24/2002  Vaping Use  . Vaping Use: Never used  Substance Use Topics  . Alcohol use: Not Currently    Alcohol/week: 3.0 standard drinks of alcohol    Types: 3 Glasses of wine per week    Comment: occasional wine & beer  . Drug use: No   Social History   Social History Narrative  . Not on file    OBJCTIVE -PE, EKG,  labs   Wt Readings from Last 3 Encounters:  03/16/23 208 lb 4 oz (94.5 kg)  09/28/22 206 lb 3.2 oz (93.5 kg)  09/20/22 205 lb (93 kg)    Physical Exam: BP 130/70 (BP Location: Left Arm, Patient Position: Sitting, Cuff Size: Normal)   Pulse 78   Ht 5\' 11"  (1.803 m)   Wt 208 lb 4 oz (94.5 kg)   SpO2 98%   BMI 29.04 kg/m  Physical Exam Vitals reviewed.  Constitutional:      General: He is not in acute distress.    Appearance: Normal appearance. He is not ill-appearing or toxic-appearing.  HENT:     Head: Normocephalic and atraumatic.  Neck:     Vascular: No carotid bruit or JVD.  Cardiovascular:     Rate and Rhythm: Normal rate and regular rhythm. No extrasystoles are present.    Chest Wall: PMI is not displaced.     Pulses: Normal pulses. No decreased pulses.     Heart sounds: Normal heart sounds, S1 normal and S2 normal. No murmur heard.    No friction rub. No gallop.  Pulmonary:     Effort: Pulmonary effort is normal.     Breath sounds: Normal breath sounds.  Musculoskeletal:        General: No swelling. Normal range of motion.     Cervical back: Normal range of motion and neck supple.  Skin:    General: Skin is warm and dry.  Neurological:     General: No focal deficit present.     Mental Status:  He is alert and oriented to person, place, and time.     Gait: Gait normal.  Psychiatric:        Mood and Affect: Mood normal.        Behavior: Behavior normal.        Thought Content: Thought content normal.        Judgment: Judgment normal.    Adult ECG Report  Rate: *** ;  Rhythm: {rhythm:17366};   Narrative Interpretation: ***  Recent Labs: Reviewed Lab Results  Component Value Date   CHOL 162 08/18/2022   HDL 68 08/18/2022   LDLCALC 76 08/18/2022   TRIG 92 08/18/2022   CHOLHDL 2.4 08/18/2022   Lab Results  Component Value Date   CREATININE 1.02 09/14/2022   BUN 13 09/14/2022   NA 143 09/14/2022   K 4.4 09/14/2022   CL 104 09/14/2022   CO2 25 09/14/2022      Latest Ref Rng & Units 09/14/2022   12:08 PM 08/18/2022    3:46 PM 08/16/2022   12:36 PM  CBC  WBC 3.4 - 10.8 x10E3/uL 6.8  6.6  7.4   Hemoglobin 13.0 - 17.7 g/dL 62.1  30.8  65.7   Hematocrit 37.5 - 51.0 % 46.5  45.1  44.6   Platelets 150 - 450 x10E3/uL 246  279  247     Lab Results  Component Value Date   HGBA1C 5.3 08/04/2021   Lab Results  Component Value Date   TSH 0.92 08/18/2022    ================================================== I spent a total of ***minutes with the patient spent in direct patient consultation.  Additional time spent with chart review  / charting (studies, outside notes, etc): *** min Total Time: *** min  Current medicines are reviewed at length with the patient today.  (+/- concerns) ***  Notice: This dictation was prepared with Dragon dictation along with smart phrase technology. Any transcriptional errors that result from  this process are unintentional and may not be corrected upon review.  Studies Ordered:   No orders of the defined types were placed in this encounter.  No orders of the defined types were placed in this encounter.   Patient Instructions / Medication Changes & Studies & Tests Ordered   There are no Patient Instructions on file for this  visit.     Marykay Lex, MD, MS Bryan Lemma, M.D., M.S. Interventional Cardiologist  [facility]  Pager # [phone] Phone # [phone] [address] [place], [place] [address]   Thank you for choosing [facility] [place]!!

## 2023-03-16 NOTE — Assessment & Plan Note (Addendum)
I think he mistakenly was to give the metoprolol was causing arthralgias and therefore stopped taking it.  However since being off it, he still has arthralgias.  My suspicion is that the symptoms are more related to rosuvastatin. I had asked him to hold both metoprolol and rosuvastatin to allow for statin holiday and then after being off both for 2 weeks, he would reinitiate either statin or beta-blocker to see if symptoms recurred.  His symptoms recurred with 1 versus the other, then we would know the answer.  Unfortunate he did not do that.  Plan: Hold rosuvastatin for 1 month. ->  If no change in symptoms, restart Use metoprolol to tartrate 25 mg nightly as needed palpitations.  We will reassess in 2 to 3 months to see if arthralgias improved being off statin. Will also reassess the relative usage of beta-blocker, and consider potentially starting low-dose Toprol nightly.

## 2023-03-16 NOTE — Patient Instructions (Signed)
Medication Instructions:  Your physician has recommended you make the following change in your medication:   HOLD - rosuvastatin (CRESTOR) 10 MG tablet for 1 month (if hand stiffness better will stay off and draw labs at next appointment)  START - metoprolol tartrate (LOPRESSOR) 25 MG tablet - Take 1 tablet (25 mg total) by mouth at bedtime as needed (palpitations).  *If you need a refill on your cardiac medications before your next appointment, please call your pharmacy*  Lab Work: -None ordered If you have labs (blood work) drawn today and your tests are completely normal, you will receive your results only by: MyChart Message (if you have MyChart) OR A paper copy in the mail If you have any lab test that is abnormal or we need to change your treatment, we will call you to review the results.  Testing/Procedures: -None ordered  Follow-Up: At [facility], you and your health needs are our priority.  As part of our continuing mission to provide you with exceptional heart care, we have created designated Provider Care Teams.  These Care Teams include your primary Cardiologist (physician) and Advanced Practice Providers (APPs -  Physician Assistants and Nurse Practitioners) who all work together to provide you with the care you need, when you need it.  Your next appointment:   3 month(s)  Provider:   Bryan Lemma, MD    Other Instructions -None

## 2023-03-16 NOTE — Assessment & Plan Note (Signed)
Abnormal GXT because of frequent PVCs.  Thankfully, normal coronaries on cardiac catheterization.  In the future, would probably choose a different modality if the plan is to evaluate for ischemia. Otherwise, to assess PVC burden, GXT would be reasonable.

## 2023-03-19 NOTE — Assessment & Plan Note (Signed)
His most recent lipids showed LDL of 76 which is relatively decent amount of control based on his coronary evaluation.  Should be well-tolerated 1 month statin holiday to determine symptoms are related to statin or not. If he does have improvement of symptoms being off of, and having had similar symptoms with atorvastatin, we could potentially consider using pravastatin or even potentially Livalo.

## 2023-05-31 ENCOUNTER — Encounter: Payer: Self-pay | Admitting: Family Medicine

## 2023-06-01 ENCOUNTER — Other Ambulatory Visit: Payer: Self-pay | Admitting: Family Medicine

## 2023-06-01 ENCOUNTER — Ambulatory Visit
Admission: RE | Admit: 2023-06-01 | Discharge: 2023-06-01 | Disposition: A | Discharge status: Home / Self Care | Payer: PPO | Source: Ambulatory Visit | Attending: Family Medicine | Admitting: Family Medicine

## 2023-06-01 DIAGNOSIS — M545 Low back pain, unspecified: Secondary | ICD-10-CM | POA: Diagnosis present

## 2023-06-01 DIAGNOSIS — G8929 Other chronic pain: Secondary | ICD-10-CM | POA: Diagnosis present

## 2023-06-01 MED ORDER — GADOBUTROL 1 MMOL/ML IV SOLN
9.0000 mL | Freq: Once | INTRAVENOUS | Status: AC | PRN
  Administered 2023-06-01: 9 mL via INTRAVENOUS

## 2023-06-14 ENCOUNTER — Telehealth: Payer: Self-pay | Admitting: Neurosurgery

## 2023-06-14 NOTE — Telephone Encounter (Signed)
Disc levels:   Disc desiccation from L1-2 through L4-5. Mild disc space narrowing at L3-4 and L4-5.   T12-L1: Negative.   L1-2: Disc bulging and mild facet hypertrophy without stenosis.   L2-3: Disc bulging and mild facet hypertrophy without stenosis.   L3-4: Disc bulging eccentric to the right and mild to moderate facet hypertrophy result in mild bilateral lateral recess stenosis and mild-to-moderate right neural foraminal stenosis without spinal stenosis. Central annular fissure.   L4-5: Disc bulging, a superimposed small central disc protrusion with annular fissure, mildly prominent dorsal epidural fat, and moderate facet hypertrophy result in mild spinal stenosis, mild bilateral lateral recess stenosis, and mild-to-moderate bilateral neural foraminal stenosis.   L5-S1: Moderate to severe right and mild-to-moderate left facet hypertrophy without disc herniation or stenosis.   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  Wife is calling, she would like to schedule her husband with Dr.Yarbrough. Numbness and sometimes burning in the groin area. Sometimes he will have issues with urine and bowel  incontinence. This has been going on for months but symptoms seem to be getting worse. No PT or injections in the past year. Wife is a Engineer, civil (consulting) a believes he needs surgery. Schedule with MD or PA?

## 2023-06-14 NOTE — Telephone Encounter (Signed)
Wife called back and said to put this message on hold. They were able to get an appt for this Monday at a different spine center.

## 2023-06-29 ENCOUNTER — Encounter: Payer: Self-pay | Admitting: Cardiology

## 2023-06-29 ENCOUNTER — Ambulatory Visit: Payer: PPO | Attending: Cardiology | Admitting: Cardiology

## 2023-06-29 VITALS — BP 144/80 | HR 64 | Ht 71.0 in | Wt 207.6 lb

## 2023-06-29 DIAGNOSIS — I10 Essential (primary) hypertension: Secondary | ICD-10-CM

## 2023-06-29 DIAGNOSIS — I493 Ventricular premature depolarization: Secondary | ICD-10-CM

## 2023-06-29 NOTE — Patient Instructions (Signed)
Medication Instructions:  Your physician recommends that you continue on your current medications as directed. Please refer to the Current Medication list given to you today.  *If you need a refill on your cardiac medications before your next appointment, please call your pharmacy*   Lab Work: none If you have labs (blood work) drawn today and your tests are completely normal, you will receive your results only by: MyChart Message (if you have MyChart) OR A paper copy in the mail If you have any lab test that is abnormal or we need to change your treatment, we will call you to review the results.   Testing/Procedures: none   Follow-Up: At [facility], you and your health needs are our priority.  As part of our continuing mission to provide you with exceptional heart care, we have created designated Provider Care Teams.  These Care Teams include your primary Cardiologist (physician) and Advanced Practice Providers (APPs -  Physician Assistants and Nurse Practitioners) who all work together to provide you with the care you need, when you need it.  We recommend signing up for the patient portal called "MyChart".  Sign up information is provided on this After Visit Summary.  MyChart is used to connect with patients for Virtual Visits (Telemedicine).  Patients are able to view lab/test results, encounter notes, upcoming appointments, etc.  Non-urgent messages can be sent to your provider as well.   To learn more about what you can do with MyChart, go to [email or web address].    Your next appointment:   3-4 months  Provider:   Bryan Lemma, MD

## 2023-06-29 NOTE — Progress Notes (Signed)
Very nervous at baseline.  Also somewhat stressed because his morning clinic visit was delayed due to a emergency STEMI case.

## 2023-06-29 NOTE — Progress Notes (Signed)
Notably anxious - rescheduled earlier appt due to emergency STEMI case.

## 2023-06-29 NOTE — Assessment & Plan Note (Signed)
BP is elevated today, likely related to whitecoat hypertension.  Usually well-controlled at home. Somewhat anxious because of delayed appointment -> 0840 appointment rescheduled to 11:00 because of emergency case.  Will monitor, hopefully with restarting beta-blocker the pressure will improve.

## 2023-06-29 NOTE — Progress Notes (Signed)
Cardiology Office Note:  .   Date:  06/29/2023  ID:  [name], DOB 07-Jun-1946, MRN [id number] PCP: Carlos Baseman, MD  [facility] Providers Cardiologist:  Bryan Lemma, MD     Chief Complaint  Patient presents with   Follow-up    3 month follow up visit. Patient states that his heart is still skipping, but he is experiencing no pain. Meds reviewed.     History of Present Illness: .     [name] is a  77 y.o. male with a PMH notable for HTN, HLD and symptomatic PVCs who presents here for 3-76-month.  He returns today at the request of Carlos Baseman, MD.  Danek Wiederkehr Bickle was last seen on 03/16/2023 as a work in appointment having called and a week before noting joint pain related to metoprolol; reduced to BID 25 mg.      Subjective   INTERVAL HISTORY [name] returns today for follow-up somewhat sheepishly acknowledging that a few months ago he stopped taking his metoprolol because of palpitations that improved.  Unfortunately over the last month or so the palpitations have come back but just not as significant.  Mostly at night when he is just feels a forceful pounding sensation and because he sleeps on his side he "feels his heart beating" in his ear.  He does acknowledge that he has had a lot of more stress of late dealing with some low back pain issues.  He recently underwent an MRI which was something that he been putting off for several years until they had a scanner big enough for him.  The back is bothering him some and he is not as active.  He also has been noticing some lower abdominal pain and is pending an evaluation with urology.  He is notably concerned about that as well.  He thinks that the palpitations started back in about the timeframe of his back and abdominal pain.  He says the palpitations are not as frequent usually in the evening.  He is restarted taking his metoprolol but just 1/2 tablet twice a day and thinks it may have improved some but he has  been doing it long enough.  He denies any prolonged rapid irregular heartbeats.  Just short palpitation spells.  Mostly notices forceful beats. No chest pain pressure or dyspnea with rest or exertion, just somewhat deconditioned because back pain bothers him.  He also has been noticing some postnasal drip type symptoms and his wife is concerned about his coughing.  He clears his throat a lot.  Has been worse over the end of the summer months.  No associated PND or orthopnea.  No edema.   ROS:  Cardiovascular ROS: no chest pain or dyspnea on exertion positive for - palpitations and describes it as a forceful beat.  Not as significant as it had been. negative for - orthopnea, paroxysmal nocturnal dyspnea, rapid heart rate, shortness of breath, or lightheadedness, dizziness or wooziness, syncope/near syncope or TIA/amaurosis fugax, claudication Review of Systems - Negative except back pain, & lower abd - urology pain. Post-nasal drip / drainage with more throat clearing & cough.  Is also quite anxious today.     Objective   Studies Reviewed: [name]        EXERCISE TREADMILL STRESS TEST 08/22/2022    Dr. Harl Bowie: Exercised for 10 minutes.  Stopped due to dyspnea and tiredness/fatigue.  No chest pain.  PVC/aberrant beats noted during stress test.  No anginal pain  or EKG changes. => Referred for cath   HOLTER MONITOR 07/2022    No evidence of VT.  HR range 42 over 104 bpm.  Average 61 bpm.  Rare PACs and PVCs.  Some bigeminy.  No sustained arrhythmias.   LEFT HEART CATH AND CORONARY ANGIOGRAPHY 09/20/2022    Procedure: LEFT HEART CATH AND CORONARY ANGIOGRAPHY;  Surgeon: Marykay Lex, MD;  Location: [facility] INVASIVE CV LAB;  Service: Cardiovascular'; Minimal CAD -essentially angiographically normal right dominant system.  Due to tortuous innominate artery, recommend either leftr adial or femoral access if catheterization considered in the future.     TRANSTHORACIC ECHOCARDIOGRAM   08/22/2022    Normal  LV size and function.  EF 55%.  No WMA.  No effusion.  Normal valves    Risk Assessment/Calculations:     HYPERTENSION CONTROL Vitals:   06/29/23 1102 06/29/23 1120  BP: (!) 150/82 (!) 144/80    The patient's blood pressure is elevated above target today.  In order to address the patient's elevated BP: Blood pressure will be monitored at home to determine if medication changes need to be made.; The blood pressure is usually elevated in clinic.  Blood pressures monitored at home have been optimal.           Physical Exam:   VS:  BP (!) 144/80   Pulse 64   Ht 5\' 11"  (1.803 m)   Wt 207 lb 9.6 oz (94.2 kg)   SpO2 98%   BMI 28.95 kg/m    Wt Readings from Last 3 Encounters:  06/29/23 207 lb 9.6 oz (94.2 kg)  03/16/23 208 lb 4 oz (94.5 kg)  09/28/22 206 lb 3.2 oz (93.5 kg)    GEN: Well nourished, well developed in no acute distress; mildly anxious. NECK: No JVD; No carotid bruits CARDIAC: Normal S1, S2; RRR rare ectopy, no murmurs, rubs, gallops RESPIRATORY:  Clear to auscultation without rales, wheezing or rhonchi ; nonlabored, good air movement. ABDOMEN: Soft, non-tender, non-distended EXTREMITIES:  No edema; No deformity      ASSESSMENT AND PLAN: .    Problem List Items Addressed This Visit       Cardiology Problems   Symptomatic PVCs - Primary (Chronic)    He restarted the Lopressor at 1/2 tablet twice daily, but his symptoms are worse in the evening.  Recommend doing one half of the morning and full tablet in the evening.  Reassess in about 6 weeks (he will contact us and let us know what he is doing).  If stable, we will probably convert to a Toprol long-acting dose in the evening.  Can use a short acting medication as PRN.      Essential hypertension (Chronic)    BP is elevated today, likely related to whitecoat hypertension.  Usually well-controlled at home. Somewhat anxious because of delayed appointment -> 0840 appointment rescheduled to 11:00 because of  emergency case.  Will monitor, hopefully with restarting beta-blocker the pressure will improve.              Dispo: Return in about 4 months (around 10/29/2023) for 3-4 month follow-up, Routine follow up with me, Follow-up in [place].  Total time spent: 19 min spent with patient + 14 min spent charting = 33 min      Signed, Marykay Lex, MD, MS Bryan Lemma, M.D., M.S. Interventional Cardiologist  [facility]  Pager # [phone] Phone # [phone] [address] [place], [place] [address]

## 2023-06-29 NOTE — Assessment & Plan Note (Signed)
He restarted the Lopressor at 1/2 tablet twice daily, but his symptoms are worse in the evening.  Recommend doing one half of the morning and full tablet in the evening.  Reassess in about 6 weeks (he will contact us and let us know what he is doing).  If stable, we will probably convert to a Toprol long-acting dose in the evening.  Can use a short acting medication as PRN.

## 2023-07-05 ENCOUNTER — Encounter: Payer: Self-pay | Admitting: Urology

## 2023-07-05 ENCOUNTER — Ambulatory Visit: Payer: PPO | Admitting: Urology

## 2023-07-05 VITALS — BP 144/79 | HR 70 | Ht 71.0 in | Wt 207.0 lb

## 2023-07-05 DIAGNOSIS — R3129 Other microscopic hematuria: Secondary | ICD-10-CM

## 2023-07-05 DIAGNOSIS — R102 Pelvic and perineal pain: Secondary | ICD-10-CM | POA: Diagnosis not present

## 2023-07-05 LAB — MICROSCOPIC EXAMINATION

## 2023-07-05 LAB — URINALYSIS, COMPLETE
Bilirubin, UA: NEGATIVE
Glucose, UA: NEGATIVE
Leukocytes,UA: NEGATIVE
Nitrite, UA: NEGATIVE
Specific Gravity, UA: 1.025 (ref 1.005–1.030)
Urobilinogen, Ur: 0.2 mg/dL (ref 0.2–1.0)
pH, UA: 5.5 (ref 5.0–7.5)

## 2023-07-05 MED ORDER — AMITRIPTYLINE HCL 10 MG PO TABS
10.0000 mg | ORAL_TABLET | Freq: Every day | ORAL | 0 refills | Status: DC
Start: 2023-07-05 — End: 2023-08-25

## 2023-07-05 NOTE — Progress Notes (Signed)
I, Duke Salvia, acting as a scribe for Riki Altes, MD., have documented all relevant documentation on the behalf of Riki Altes, MD, as directed by  Riki Altes, MD while in the presence of Riki Altes, MD.  07/05/2023 9:36 AM   [name] [birth year] [id number]  Referring provider: Dorothey Baseman, MD [phone] [place],  [place] [address]  Chief Complaint  Patient presents with   Follow-up    HPI: [name] is a 77 y.o. male presenting foe evaluation of pelvic pain.  Long history of pelvic pain and lower urinary tract symptoms. Approximately 30 years ago, he was seen in [place] and treated for possible interstitial cystitis. He has intermittent episodes of groin, pelvic, and perineal pain. No identifiable, precipitating, aggravating, or alleviating factors. No dysuria or gross hematuria. No abdominal, pelvic, or flank pain. 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 He saw Dr. Evelene Croon in 2019 and had a cystoscopy, which showed moderate prostate enlargement. He has been on Tamsulosin in the past without improvement in his symptoms. IPSS today 11/35 with most bothersome symptoms frequency and urgency He notes occasional mild dysuria. He has been on antibiotic courses in the past, which have not significantly improved his symptoms. No recent pelvic imaging.   PMH: Past Medical History:  Diagnosis Date   Actinic keratosis    Allergy    Arrhythmia    New problem within past 3 months   Arthritis    Basal cell carcinoma 08/12/2020   L post lat base of neck    BCC (basal cell carcinoma of skin) 02/17/2021   L infra auricular, exc 03/02/21   History of colon polyps    Hyperlipidemia    Hypertension    Melanoma (HCC) 2009   left top of shoulder. Dr. Orson Aloe   Seasonal allergies    Squamous cell carcinoma of  skin 11/02/2017   left temple anterior to sideburn. [facility] 11/02/2017    Surgical History: Past Surgical History:  Procedure Laterality Date   COLONOSCOPY     EXERCISE TREADMILL STRESS TEST  08/22/2022   Dr. Harl Bowie: Exercised for 10 minutes.  Stopped due to dyspnea and tiredness/fatigue.  No chest pain.  PVC/aberrant beats noted during stress test.  No anginal pain or EKG changes.   HOLTER MONITOR  07/2022   No evidence of VT.  HR range 42 over 104 bpm.  Average 61 bpm.  Rare PACs and PVCs.  Some bigeminy.  No sustained arrhythmias.   LEFT HEART CATH AND CORONARY ANGIOGRAPHY N/A 09/20/2022   Procedure: LEFT HEART CATH AND CORONARY ANGIOGRAPHY;  Surgeon: Marykay Lex, MD;  Location: [facility] INVASIVE CV LAB;  Service: Cardiovascular'; Minimal CAD -essentially angiographically normal right dominant system.  Due to tortuous innominate artery, recommend either leftr adial or femoral access if catheterization considered in the future.   MELANOMA EXCISION  2009   POLYPECTOMY     TOTAL KNEE ARTHROPLASTY Right 2011   TOTAL KNEE ARTHROPLASTY Left    TRANSTHORACIC ECHOCARDIOGRAM  08/22/2022   Normal LV size and function.  EF 55%.  No WMA.  No effusion.  Normal valves    Home Medications:  Allergies as of 07/05/2023       Reactions   Atorvastatin Other (See Comments)   Muscle pain  Medication List        Accurate as of July 05, 2023  9:36 AM. If you have any questions, ask your nurse or doctor.          amitriptyline 10 MG tablet Commonly known as: ELAVIL Take 1 tablet (10 mg total) by mouth daily. Started by: Riki Altes   amLODipine 5 MG tablet Commonly known as: NORVASC TAKE 1 TABLET BY MOUTH EVERY DAY   amoxicillin 500 MG tablet Commonly known as: AMOXIL Take 2,000 mg by mouth once. 1 hour prior to Dental procedure   aspirin EC 81 MG tablet Take 1 tablet (81 mg total) by mouth daily. Swallow whole.   CENTRUM SILVER PO Take 1 tablet by mouth daily.    losartan 100 MG tablet Commonly known as: COZAAR Take 1 tablet (100 mg total) by mouth daily.   METAMUCIL PO Take 2 scoop by mouth at bedtime.   methocarbamol 750 MG tablet Commonly known as: ROBAXIN take 1 tablet by oral route 3 times every day PRN muscle spasms   metoprolol tartrate 25 MG tablet Commonly known as: LOPRESSOR Take 1 tablet (25 mg total) by mouth at bedtime as needed (palpitations).   Milk Thistle 250 MG Caps Take 500 mg by mouth 2 (two) times daily.   rosuvastatin 10 MG tablet Commonly known as: CRESTOR TAKE 1 TABLET BY MOUTH EVERY DAY        Allergies:  Allergies  Allergen Reactions   Atorvastatin Other (See Comments)    Muscle pain    Family History: Family History  Problem Relation Age of Onset   Colon cancer Mother        died age 59-dx'd late 7-0's   Hypertension Mother    Arrhythmia Father        Died of heart attack when he was 51   Heart attack Father    Arrhythmia Paternal Uncle        Deceased with heart disease   Heart attack Paternal Uncle    Heart disease Paternal Uncle    Esophageal cancer Neg Hx    Rectal cancer Neg Hx    Stomach cancer Neg Hx    Crohn's disease Neg Hx     Social History:  reports that he quit smoking about 50 years ago. His smoking use included cigarettes. He started smoking about 60 years ago. He has a 2.5 pack-year smoking history. He quit smokeless tobacco use about 20 years ago. He reports that he does not currently use alcohol after a past usage of about 3.0 standard drinks of alcohol per week. He reports that he does not use drugs.   Physical Exam: BP (!) 144/79   Pulse 70   Ht 5\' 11"  (1.803 m)   Wt 207 lb (93.9 kg)   BMI 28.87 kg/m   Constitutional:  Alert and oriented, No acute distress. HEENT: [place] AT, moist mucus membranes.  Trachea midline, no masses. Cardiovascular: No clubbing, cyanosis, or edema. Respiratory: Normal respiratory effort, no increased work of breathing. GI: Abdomen is soft,  nontender, nondistended, no abdominal masses GU: Mild-moderate pelvic floor tenderness. Prostate small, 30 g, smooth, non-tender. Skin: No rashes, bruises or suspicious lesions. Neurologic: Grossly intact, no focal deficits, moving all 4 extremities. Psychiatric: Normal mood and affect.    Laboratory Data:  Urinalysis Pending     Assessment & Plan:    1. Pelvic pain We discussed potential etiologies including neuropathic pain/chronic pelvic pain syndrome. Schedule pelvic CT to evaluate for any  obvious abnormalities. Trial amitriptyline 10 mg QHS. Follow up in 1 month for reassessment of symptoms. Consider PT referral for pelvic floor physical therapy.  Addendum:  Urinalysis returned abnormal 3-10 RBCs/hpf.  Patient was contacted with recommendations of scheduling a CT urogram and lieu of CT pelvis and cystoscopy  I have reviewed the above documentation for accuracy and completeness, and I agree with the above.   Riki Altes, MD   [facility] [address] [place], [place] [address] [phone]

## 2023-07-06 ENCOUNTER — Encounter: Payer: Self-pay | Admitting: Urology

## 2023-07-07 ENCOUNTER — Other Ambulatory Visit: Payer: PPO

## 2023-07-07 DIAGNOSIS — R102 Pelvic and perineal pain: Secondary | ICD-10-CM

## 2023-07-07 DIAGNOSIS — R3129 Other microscopic hematuria: Secondary | ICD-10-CM

## 2023-07-07 LAB — URINALYSIS, COMPLETE
Bilirubin, UA: NEGATIVE
Glucose, UA: NEGATIVE
Ketones, UA: NEGATIVE
Leukocytes,UA: NEGATIVE
Nitrite, UA: NEGATIVE
Protein,UA: NEGATIVE
RBC, UA: NEGATIVE
Specific Gravity, UA: 1.015 (ref 1.005–1.030)
Urobilinogen, Ur: 0.2 mg/dL (ref 0.2–1.0)
pH, UA: 5.5 (ref 5.0–7.5)

## 2023-07-07 LAB — MICROSCOPIC EXAMINATION: Bacteria, UA: NONE SEEN

## 2023-07-08 ENCOUNTER — Other Ambulatory Visit: Payer: Self-pay | Admitting: Internal Medicine

## 2023-07-08 ENCOUNTER — Encounter: Payer: Self-pay | Admitting: Urology

## 2023-07-10 LAB — CULTURE, URINE COMPREHENSIVE

## 2023-07-10 NOTE — Telephone Encounter (Signed)
Patient read his mychart message

## 2023-07-19 ENCOUNTER — Ambulatory Visit
Admission: RE | Admit: 2023-07-19 | Discharge: 2023-07-19 | Disposition: A | Discharge status: Home / Self Care | Payer: PPO | Source: Ambulatory Visit | Attending: Urology | Admitting: Urology

## 2023-07-19 DIAGNOSIS — R102 Pelvic and perineal pain: Secondary | ICD-10-CM | POA: Insufficient documentation

## 2023-07-19 LAB — POCT I-STAT CREATININE: Creatinine, Ser: 1.1 mg/dL (ref 0.61–1.24)

## 2023-07-19 MED ORDER — IOHEXOL 300 MG/ML  SOLN
100.0000 mL | Freq: Once | INTRAMUSCULAR | Status: AC | PRN
  Administered 2023-07-19: 100 mL via INTRAVENOUS

## 2023-07-27 ENCOUNTER — Other Ambulatory Visit: Payer: Self-pay | Admitting: Urology

## 2023-07-27 ENCOUNTER — Encounter: Payer: Self-pay | Admitting: Cardiology

## 2023-07-27 DIAGNOSIS — R102 Pelvic and perineal pain: Secondary | ICD-10-CM

## 2023-07-30 NOTE — Telephone Encounter (Signed)
Lets have him convert his metoprolol to Toprol XL 50 mg every evening.  He can then use the short acting ones that he has if he has breakthrough spells of palpitations.  New Rx metoprolol succinate (Toprol-XL) 50 mg p.o. every evening; Disp #90 tab, 3 refills.  Change metoprolol tartrate (Lopressor) to 12.5 mg (one half of 25 mg tab) as needed for the spells of palpitations.  Bryan Lemma, MD

## 2023-07-31 NOTE — Telephone Encounter (Signed)
As needed for prolonged palpitations or significant abnormal palpitations not controlled with STEMI medication.  Is basely based on his symptoms.  Bryan Lemma, MD

## 2023-08-01 ENCOUNTER — Encounter: Payer: Self-pay | Admitting: Urology

## 2023-08-01 NOTE — Telephone Encounter (Signed)
Yes.  In 77 year old this is somewhat expected.  The good news is is that the even though there may be some coronary calcification noted on the outside of the artery and not in the inside.  We just continue to treat risk factors.  Bryan Lemma, MD

## 2023-08-02 ENCOUNTER — Ambulatory Visit: Payer: PPO | Admitting: Urology

## 2023-08-02 ENCOUNTER — Telehealth: Payer: Self-pay | Admitting: *Deleted

## 2023-08-02 ENCOUNTER — Encounter: Payer: Self-pay | Admitting: Urology

## 2023-08-02 VITALS — BP 150/81 | HR 80 | Ht 71.0 in | Wt 201.0 lb

## 2023-08-02 DIAGNOSIS — R3129 Other microscopic hematuria: Secondary | ICD-10-CM

## 2023-08-02 DIAGNOSIS — R102 Pelvic and perineal pain: Secondary | ICD-10-CM | POA: Diagnosis not present

## 2023-08-02 LAB — URINALYSIS, COMPLETE
Bilirubin, UA: NEGATIVE
Glucose, UA: NEGATIVE
Ketones, UA: NEGATIVE
Leukocytes,UA: NEGATIVE
Nitrite, UA: NEGATIVE
Protein,UA: NEGATIVE
RBC, UA: NEGATIVE
Specific Gravity, UA: <1.005 — ABNORMAL LOW (ref 1.005–1.030)
Urobilinogen, Ur: 0.2 mg/dL (ref 0.2–1.0)
pH, UA: 6.5 (ref 5.0–7.5)

## 2023-08-02 LAB — MICROSCOPIC EXAMINATION
Bacteria, UA: NONE SEEN
RBC, Urine: NONE SEEN /[HPF] (ref 0–2)

## 2023-08-02 NOTE — Progress Notes (Signed)
   08/02/23  CC:  Chief Complaint  Patient presents with   Cysto    HPI: Refer to my previous note 07/05/2023.  He states he did not take the amitriptyline regularly.  CT showed a nonobstructing punctate right lower pole renal calculus.  Bilateral renal cysts were present.  Blood pressure (!) 150/81, pulse 80, height 5\' 11"  (1.803 m), weight 201 lb (91.2 kg). NED. A&Ox3.   No respiratory distress   Abd soft, NT, ND Normal phallus with bilateral descended testicles  Cystoscopy Procedure Note  Patient identification was confirmed, informed consent was obtained, and patient was prepped using Betadine solution.  Lidocaine jelly was administered per urethral meatus.     Pre-Procedure: - Inspection reveals a normal caliber urethral meatus.  Procedure: The flexible cystoscope was introduced without difficulty - No urethral strictures/lesions are present. 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

## 2023-08-02 NOTE — Telephone Encounter (Signed)
Received MyChart message under patient's wife with the following:  My husband [name] is a patient of Dr Lanetta Inch. He has been having some issues with abdominal Al and pelvic pain and is due for a f/u colonoscopy per notifications on his phone from MyChart. He has been unable to set up an appt on MyChart or reach the providers in your department. Could you please see about scheduling him for an appt for f/u. He has had a recent MRI and CT scan with reports on MyChart. He has also had labs which indicated a drop in his RBCs. He has not had any signs of rectal bleeding. His DOB is Feb 20, 1946. Hopefully you can reach him with a message on MyChart. Thank you.

## 2023-08-04 ENCOUNTER — Ambulatory Visit: Payer: PPO | Admitting: Urology

## 2023-08-07 MED ORDER — METOPROLOL TARTRATE 25 MG PO TABS: 25.0000 mg | ORAL_TABLET | Freq: Every evening | ORAL | Status: DC | PRN

## 2023-08-09 ENCOUNTER — Ambulatory Visit: Payer: PPO | Admitting: Gastroenterology

## 2023-08-16 ENCOUNTER — Encounter: Payer: Self-pay | Admitting: Cardiology

## 2023-08-16 ENCOUNTER — Encounter: Payer: Self-pay | Admitting: Dermatology

## 2023-08-16 ENCOUNTER — Ambulatory Visit: Payer: PPO | Admitting: Dermatology

## 2023-08-16 DIAGNOSIS — L570 Actinic keratosis: Secondary | ICD-10-CM

## 2023-08-16 DIAGNOSIS — L820 Inflamed seborrheic keratosis: Secondary | ICD-10-CM | POA: Diagnosis not present

## 2023-08-16 DIAGNOSIS — L814 Other melanin hyperpigmentation: Secondary | ICD-10-CM | POA: Diagnosis not present

## 2023-08-16 DIAGNOSIS — Z1283 Encounter for screening for malignant neoplasm of skin: Secondary | ICD-10-CM

## 2023-08-16 DIAGNOSIS — L821 Other seborrheic keratosis: Secondary | ICD-10-CM

## 2023-08-16 DIAGNOSIS — W908XXA Exposure to other nonionizing radiation, initial encounter: Secondary | ICD-10-CM | POA: Diagnosis not present

## 2023-08-16 DIAGNOSIS — D229 Melanocytic nevi, unspecified: Secondary | ICD-10-CM

## 2023-08-16 DIAGNOSIS — Z85828 Personal history of other malignant neoplasm of skin: Secondary | ICD-10-CM

## 2023-08-16 DIAGNOSIS — L578 Other skin changes due to chronic exposure to nonionizing radiation: Secondary | ICD-10-CM

## 2023-08-16 DIAGNOSIS — Z8589 Personal history of malignant neoplasm of other organs and systems: Secondary | ICD-10-CM

## 2023-08-16 DIAGNOSIS — Z85820 Personal history of malignant melanoma of skin: Secondary | ICD-10-CM

## 2023-08-16 NOTE — Patient Instructions (Addendum)

## 2023-08-16 NOTE — Progress Notes (Signed)
Follow-Up Visit   Subjective  [name] is a 77 y.o. male who presents for the following: Skin Cancer Screening and Full Body Skin Exam. HxMM, HxBCC, HxSCC.  The patient presents for Total-Body Skin Exam (TBSE) for skin cancer screening and mole check. The patient has spots, moles and lesions to be evaluated, some may be new or changing and the patient may have concern these could be cancer.    The following portions of the chart were reviewed this encounter and updated as appropriate: medications, allergies, medical history  Review of Systems:  No other skin or systemic complaints except as noted in HPI or Assessment and Plan.  Objective  Well appearing patient in no apparent distress; mood and affect are within normal limits.  A full examination was performed including scalp, head, eyes, ears, nose, lips, neck, chest, axillae, abdomen, back, buttocks, bilateral upper extremities, bilateral lower extremities, hands, feet, fingers, toes, fingernails, and toenails. All findings within normal limits unless otherwise noted below.   Relevant physical exam findings are noted in the Assessment and Plan.  Right Forearm x1, right tricep x1, left scalp x1 (3) Erythematous keratotic or waxy stuck-on papule or plaque.  face x8 (8) Erythematous thin papules/macules with gritty scale.     Assessment & Plan   HISTORY OF MELANOMA. Top of left shoulder. 2009 - No evidence of recurrence today - No lymphadenopathy - Recommend regular full body skin exams - Recommend daily broad spectrum sunscreen SPF 30+ to sun-exposed areas, reapply every 2 hours as needed.  - Call if any new or changing lesions are noted between office visits    HISTORY OF BASAL CELL CARCINOMA OF THE SKIN. 2021 left post lat base of neck , left infra auricular excision 02/2021  - No evidence of recurrence today - Recommend regular full body skin exams - Recommend daily broad spectrum sunscreen SPF 30+ to sun-exposed  areas, reapply every 2 hours as needed.  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  - Call if any new or changing lesions are noted between office visits   SKIN CANCER SCREENING PERFORMED TODAY.  ACTINIC DAMAGE - Chronic condition, secondary to cumulative UV/sun exposure - diffuse scaly erythematous macules with underlying dyspigmentation - Recommend daily broad spectrum sunscreen SPF 30+ to sun-exposed areas, reapply every 2 hours as needed.  - Staying in the shade or wearing long sleeves, sun glasses (UVA+UVB protection) and wide brim hats (4-inch brim around the entire circumference of the hat) are also recommended for sun protection.  - Call for new or changing lesions.  LENTIGINES, SEBORRHEIC KERATOSES, HEMANGIOMAS - Benign normal skin lesions - Benign-appearing - Call for any changes  MELANOCYTIC NEVI - Tan-brown and/or pink-flesh-colored symmetric macules and papules - Benign appearing on exam today - Observation - Call clinic for new or changing moles - Recommend daily use of broad spectrum spf 30+ sunscreen to sun-exposed areas.       Inflamed seborrheic keratosis (3) Right Forearm x1, right tricep x1, left scalp x1  Symptomatic, irritating, patient would like treated.  Destruction of lesion - Right Forearm x1, right tricep x1, left scalp x1 (3) Complexity: simple  Destruction method: cryotherapy   Informed consent: discussed and consent obtained   Timeout:  patient name, date of birth, surgical site, and procedure verified Lesion destroyed using liquid nitrogen: Yes   Region frozen until ice ball extended beyond lesion: Yes   Outcome:  patient tolerated procedure well with no complications   Post-procedure details: wound care instructions given   Additional details:  Prior to procedure, discussed risks of blister formation, small wound, skin dyspigmentation, or rare scar following cryotherapy. Recommend Vaseline ointment to treated areas while healing.   AK (actinic keratosis) (8) face x8  Actinic keratoses are precancerous spots that appear secondary to cumulative UV radiation exposure/sun exposure over time. They are chronic with expected duration over 1 year. A portion of actinic keratoses will progress to squamous cell carcinoma of the skin. It is not possible to reliably predict which spots will progress to skin cancer and so treatment is recommended to prevent development of skin cancer.  Recommend daily broad spectrum sunscreen SPF 30+ to sun-exposed areas, reapply every 2 hours as needed.  Recommend staying in the shade or wearing long sleeves, sun glasses (UVA+UVB protection) and wide brim hats (4-inch brim around the entire circumference of the hat). Call for new or changing lesions.  Destruction of lesion - face x8 (8) Complexity: simple   Destruction method: cryotherapy   Informed consent: discussed and consent obtained   Timeout:  patient name, date of birth, surgical site, and procedure verified Lesion destroyed using liquid nitrogen: Yes   Region frozen until ice ball extended beyond lesion: Yes   Outcome: patient tolerated procedure well with no complications   Post-procedure details: wound care instructions given   Additional details:  Prior to procedure, discussed risks of blister formation, small wound, skin dyspigmentation, or rare scar following cryotherapy. Recommend Vaseline ointment to treated areas while healing.    Return in about 6 months (around 02/14/2024) for TBSE, HxMM, HxBCC, HxSCC.  I, Lawson Radar, CMA, am acting as scribe for Armida Sans, MD.   Documentation: I have reviewed the  above documentation for accuracy and completeness, and I agree with the above.  Armida Sans, MD

## 2023-08-17 MED ORDER — METOPROLOL SUCCINATE ER 50 MG PO TB24: 50.0000 mg | ORAL_TABLET | Freq: Every day | ORAL | 3 refills | Status: DC

## 2023-08-17 MED ORDER — METOPROLOL TARTRATE 25 MG PO TABS: 12.5000 mg | ORAL_TABLET | ORAL | Status: DC | PRN

## 2023-08-17 NOTE — Telephone Encounter (Signed)
   Per Dr Herbie Baltimore on 07/30/23-  Lets have him convert his metoprolol to Toprol XL 50 mg every evening.  He can then use the short acting ones that he has if he has breakthrough spells of palpitations.   New Rx metoprolol succinate (Toprol-XL) 50 mg p.o. every evening; Disp #90 tab, 3 refills.   Change metoprolol tartrate (Lopressor) to 12.5 mg (one half of 25 mg tab) as needed for the spells of palpitations.   Bryan Lemma, MD

## 2023-08-17 NOTE — Telephone Encounter (Signed)
Medication list changed to reflect changes . Medication e-sent to the pharmacy -- metoprolol succinate 50 mg.

## 2023-08-21 NOTE — Progress Notes (Unsigned)
08/22/2023 [name] [id number] 11-23-1945  Referring provider: Dorothey Baseman, MD Primary GI doctor: Dr. Adela Lank  ASSESSMENT AND PLAN:   Abdominal and Low Back Pain Gradual onset of abdominal and low back pain over the past 3-4 months. Pain is located in the upper groin and lower abdomen, occasionally radiating to the scrotal area. No associated nausea, vomiting, or changes in bowel habits. CT scan and cystoscopy were normal. Possible pelvic floor dysfunction or neuropathy. -Refer to pelvic floor physical therapy for evaluation and treatment. -Continue amitriptyline, as it may be helping with potential neuropathic pain. -Consider follow-up with orthopedist for possible injection trial to address potential neuropathic pain from lumbar back. -Check in 2-3 months or sooner if symptoms change (e.g., more weight loss, change in pain, fevers/chills, bowel habit changes). - patient reassured that with normal CT and recent colon, likely hood of carcinoid is very low. Can consider MRI versus repeat colon is symptoms continue.   Colon Polyp History of one adenomatous polyp and one hyperplastic polyp found on colonoscopy in 2020. Due for repeat colonoscopy in 2027. -No current action needed, continue surveillance as planned.  Hemorrhoids Known internal hemorrhoids, likely cause of positive fecal occult blood test. -Sitz baths, increase fiber, increase water -Hydrocortisone supp given and external cream sent in.  -We discussed hemorrhoid banding here in the office for internal hemorrhoids if not improving with conservative treatment. - follow up for evaluation here in the office.    Patient Care Team: Dorothey Baseman, MD as PCP - General (Family Medicine) Marykay Lex, MD as PCP - Cardiology (Cardiology)  HISTORY OF PRESENT ILLNESS: 78 y.o. male with a past medical history of symptomatic PVCs, hypertension, hyperlipidemia, melanoma and others listed below presents for  evaluation of abdominal pain.   11/2018 colonoscopy diminutive adenomatous polyp and another small hyperplastic polyp, moderate size internal hemorrhoids recall 11/2025 08/2021 office visit with Dr. Adela Lank for rectal bleeding treated with hydrocortisone suppositories Since that visit patient has been following with urology for pelvic pain.  07/19/2023 CT abdomen pelvis with contrast for pelvic pain showed no acute findings, nonobstructing right renal calculus, numerous bilateral renal cysts mildly enlarged no specific follow-up, possible right sided scrotal hydrocele, aortic atherosclerosis.  Normal stomach normal bowels normal appendix.  Mild diverticulosis no diverticulitis.  Normal liver other than to simple cyst, normal gallbladder, pancreas and spleen. 07/26/2023 labs reviewed show no anemia no leukocytosis  He has been seeing Dr. Terance Hart for lower back pain, following with urology for pelvic pain with normal cystoscopy.    Discussed the use of AI scribe software for clinical note transcription with the patient, who gave verbal consent to proceed.  The patient, with a history of adenomatous polyps and hemorrhoids, presents with a gradual onset of low back pain and groin pain that has been ongoing for several months. The pain has recently extended to the upper groin and lower abdominal area, described as occasional discomfort that is more pronounced upon standing up from a seated position. The patient denies any exacerbating factors or associated symptoms such as nausea, vomiting, or heartburn. Bowel movements are reported to be normal, occurring two to three times a day without straining, and the patient feels a sense of complete evacuation post bowel movement. The patient also reports occasional discomfort in the scrotal area and penis, but denies any current discomfort in these areas.  The patient has been active outdoors, often carrying a backpack blower for a few hours daily. Despite the  abdominal discomfort,  the patient denies any tenderness upon palpation. The patient has been experiencing urinary frequency and urgency, which has been a lifelong issue, but denies any urinary symptoms such as hesitancy or dribbling.  The patient has been on amitriptyline for a month and reports a possible improvement in symptoms since regular use. The patient also reports a weight loss of seven pounds, attributed to a change in diet. The patient has a history of falls and has had bilateral knee replacement surgery. The patient reports a numb sensation in the left knee post-surgery.   The patient has been taking Aleve every couple of weeks for severe pain and a daily baby aspirin. The patient denies any blood thinners or frequent use of NSAIDs.  Mom with similar pain and diagnosed with carcinoid tumor.    He  reports that he quit smoking about 50 years ago. His smoking use included cigarettes. He started smoking about 60 years ago. He has a 2.5 pack-year smoking history. He quit smokeless tobacco use about 20 years ago. He reports that he does not currently use alcohol after a past usage of about 3.0 standard drinks of alcohol per week. He reports that he does not use drugs.  RELEVANT LABS AND IMAGING:  Results   LABS RBC: Below normal Hb: Normal Hct: Normal  RADIOLOGY CT scan: Diverticulosis without inflammation, no narrowing or thickening  DIAGNOSTIC Rectal exam: Positive for blood, internal hemorrhoids, poor rectal tone (08/21/2023)      CBC    Component Value Date/Time   WBC 6.8 09/14/2022 1208   WBC 6.6 08/18/2022 1546   RBC 4.90 09/14/2022 1208   RBC 4.88 08/18/2022 1546   HGB 14.6 09/14/2022 1208   HCT 46.5 09/14/2022 1208   PLT 246 09/14/2022 1208   MCV 95 09/14/2022 1208   MCV 94 12/04/2013 0846   MCH 29.8 09/14/2022 1208   MCH 31.4 08/18/2022 1546   MCHC 31.4 (L) 09/14/2022 1208   MCHC 33.9 08/18/2022 1546   RDW 12.9 09/14/2022 1208   RDW 13.7 12/04/2013 0846    LYMPHSABS 2,369 08/18/2022 1546   MONOABS 0.5 08/16/2022 1236   EOSABS 139 08/18/2022 1546   BASOSABS 53 08/18/2022 1546   Recent Labs    09/14/22 1208  HGB 14.6    CMP     Component Value Date/Time   NA 143 09/14/2022 1208   NA 137 12/11/2013 0440   K 4.4 09/14/2022 1208   K 3.8 12/11/2013 0440   CL 104 09/14/2022 1208   CL 105 12/11/2013 0440   CO2 25 09/14/2022 1208   CO2 28 12/11/2013 0440   GLUCOSE 98 09/14/2022 1208   GLUCOSE 101 (H) 08/18/2022 1546   GLUCOSE 103 (H) 12/11/2013 0440   BUN 13 09/14/2022 1208   BUN 8 12/11/2013 0440   CREATININE 1.10 07/19/2023 1006   CREATININE 1.05 08/18/2022 1546   CALCIUM 9.5 09/14/2022 1208   CALCIUM 7.4 (L) 12/11/2013 0440   PROT 7.4 08/18/2022 1546   ALBUMIN 4.1 08/16/2022 1236   AST 25 08/18/2022 1546   ALT 27 08/18/2022 1546   ALKPHOS 50 08/16/2022 1236   BILITOT 0.8 08/18/2022 1546   GFRNONAA >60 08/16/2022 1236   GFRNONAA >60 12/11/2013 0440   GFRAA >60 12/11/2013 0440      Latest Ref Rng & Units 08/18/2022    3:46 PM 08/16/2022   12:36 PM 08/04/2021    9:12 AM  Hepatic Function  Total Protein 6.1 - 8.1 g/dL 7.4  7.5  7.1  Albumin 3.5 - 5.0 g/dL  4.1    AST 10 - 35 U/L 25  28  27    ALT 9 - 46 U/L 27  29  27    Alk Phosphatase 38 - 126 U/L  50    Total Bilirubin 0.2 - 1.2 mg/dL 0.8  1.0  1.1       Current Medications:    Current Outpatient Medications (Cardiovascular):    amLODipine (NORVASC) 5 MG tablet, TAKE 1 TABLET BY MOUTH EVERY DAY   losartan (COZAAR) 100 MG tablet, Take 1 tablet (100 mg total) by mouth daily.   metoprolol succinate (TOPROL-XL) 50 MG 24 hr tablet, Take 1 tablet (50 mg total) by mouth daily. Take with or immediately following a meal.   metoprolol tartrate (LOPRESSOR) 25 MG tablet, Take 0.5 tablets (12.5 mg total) by mouth as needed (palpitations).   rosuvastatin (CRESTOR) 10 MG tablet, TAKE 1 TABLET BY MOUTH EVERY DAY   Current Outpatient Medications (Analgesics):    aspirin EC 81  MG tablet, Take 1 tablet (81 mg total) by mouth daily. Swallow whole.   Current Outpatient Medications (Other):    amitriptyline (ELAVIL) 10 MG tablet, Take 1 tablet (10 mg total) by mouth daily.   amoxicillin (AMOXIL) 500 MG tablet, Take 2,000 mg by mouth once. 1 hour prior to Dental procedure   Milk Thistle 250 MG CAPS, Take 500 mg by mouth 2 (two) times daily.   Multiple Vitamins-Minerals (CENTRUM SILVER PO), Take 1 tablet by mouth daily.   Psyllium (METAMUCIL PO), Take 2 scoop by mouth at bedtime.   methocarbamol (ROBAXIN) 750 MG tablet, take 1 tablet by oral route 3 times every day PRN muscle spasms (Patient not taking: Reported on 08/22/2023)  Medical History:  Past Medical History:  Diagnosis Date   Actinic keratosis    Allergy    Arrhythmia    New problem within past 3 months   Arthritis    Basal cell carcinoma 08/12/2020   L post lat base of neck    BCC (basal cell carcinoma of skin) 02/17/2021   L infra auricular, exc 03/02/21   History of colon polyps    Hyperlipidemia    Hypertension    Melanoma (HCC) 2009   left top of shoulder. Dr. Orson Aloe   Seasonal allergies    Squamous cell carcinoma of skin 11/02/2017   left temple anterior to sideburn. [facility] 11/02/2017   Allergies:  Allergies  Allergen Reactions   Atorvastatin Other (See Comments)    Muscle pain     Surgical History:  He  has a past surgical history that includes Melanoma excision (2009); Total knee arthroplasty (Right, 2011); Total knee arthroplasty (Left); Colonoscopy; Polypectomy; LEFT HEART CATH AND CORONARY ANGIOGRAPHY (N/A, 09/20/2022); transthoracic echocardiogram (08/22/2022); EXERCISE TREADMILL STRESS TEST (08/22/2022); and HOLTER MONITOR (07/2022). Family History:  His family history includes Arrhythmia in his father and paternal uncle; Colon cancer in his mother; Heart attack in his father and paternal uncle; Heart disease in his paternal uncle; Hypertension in his mother.  REVIEW OF SYSTEMS  :  All other systems reviewed and negative except where noted in the History of Present Illness.  PHYSICAL EXAM: BP 132/70   Pulse 65   Ht 5\' 11"  (1.803 m)   Wt 204 lb 2 oz (92.6 kg)   BMI 28.47 kg/m  General Appearance: Well nourished, in no apparent distress. Head:   Normocephalic and atraumatic. Eyes:  sclerae anicteric,conjunctive pink  Respiratory: Respiratory effort normal, BS equal bilaterally without rales,  rhonchi, wheezing. Cardio: RRR with no MRGs. Peripheral pulses intact.  Abdomen: Soft,  Obese ,active bowel sounds. No tenderness . No masses. Rectal: Normal external rectal exam, decreased rectal tone, appreciated internal hemorrhoids, non-tender, no masses, brown stool, hemoccult Positive.  Musculoskeletal: Full ROM, Normal gait, lower back pain with palpation, full ROM bilateral hips without pain. Without edema. Skin:  Dry and intact without significant lesions or rashes Neuro: Alert and  oriented x4;  No focal deficits. Psych:  Cooperative. Normal mood and affect.    Doree Albee, PA-C 9:31 AM

## 2023-08-22 ENCOUNTER — Encounter: Payer: Self-pay | Admitting: Physician Assistant

## 2023-08-22 ENCOUNTER — Ambulatory Visit: Payer: PPO | Admitting: Physician Assistant

## 2023-08-22 VITALS — BP 132/70 | HR 65 | Ht 71.0 in | Wt 204.1 lb

## 2023-08-22 DIAGNOSIS — M545 Low back pain, unspecified: Secondary | ICD-10-CM

## 2023-08-22 DIAGNOSIS — K648 Other hemorrhoids: Secondary | ICD-10-CM

## 2023-08-22 DIAGNOSIS — R350 Frequency of micturition: Secondary | ICD-10-CM

## 2023-08-22 DIAGNOSIS — R1030 Lower abdominal pain, unspecified: Secondary | ICD-10-CM

## 2023-08-22 DIAGNOSIS — G8929 Other chronic pain: Secondary | ICD-10-CM

## 2023-08-22 NOTE — Progress Notes (Signed)
Agree with assessment and plan as outlined.  

## 2023-08-22 NOTE — Patient Instructions (Addendum)
Here some information about pelvic floor dysfunction. This may be contributing to some of your symptoms. We will continue with our evaluation but I do want you to consider adding on fiber supplement with low-dose MiraLAX daily. We could also refer to pelvic floor physical therapy.   Pelvic Floor Dysfunction, Male     Pelvic floor dysfunction (PFD) is a condition that results when the group of muscles and connective tissues that support the organs in the pelvis (pelvic floor muscles) do not work well. These muscles and their connections form a sling that supports the colon and bladder. In men, these muscles also support the prostate gland. PFD causes pelvic floor muscles to be too weak, too tight, or both. In PFD, muscle movements are not coordinated. This may cause bowel or bladder problems. It may also cause pain. What are the causes? This condition may be caused by an injury to the pelvic area or by a weakening of pelvic muscles. In many cases, the exact cause is not known. What increases the risk? The following factors may make you more likely to develop PFD: Having chronic bladder tissue inflammation (interstitial cystitis). Being an older person. Being overweight. History of radiation treatment for cancer in the pelvic region. Previous pelvic surgery, such as removal of the prostate gland (prostatectomy). What are the signs or symptoms? Symptoms of this condition vary and may include: Bladder symptoms, such as: Trouble starting urination and emptying the bladder. Frequent urinary tract infections. Leaking urine when coughing, laughing, or exercising (stress incontinence). Having to pass urine urgently or frequently. Pain when passing urine. Bowel symptoms, such as: Constipation. Urgent or frequent bowel movements. Incomplete bowel movements. Painful bowel movements. Leaking stool or gas. Unexplained genital or rectal pain. Genital or rectal muscle spasms. Low back  pain. Sexual dysfunction, such as erectile dysfunction, premature ejaculation, or pain during or after sexual activity. How is this diagnosed? This condition is diagnosed based on: Your symptoms and medical history. A physical exam. During the exam, your health care provider may check your pelvic muscles for tightness, spasm, pain, or weakness. This may include a rectal exam. In some cases, you may have diagnostic tests, such as: Electrical muscle function tests. Urine flow testing. X-ray tests of bowel function. Ultrasound of the pelvic organs. How is this treated? Treatment for this condition depends on your symptoms. Treatment options include: Physical therapy. This may include Kegel exercises to help relax or strengthen the pelvic floor muscles. Biofeedback. This type of therapy provides feedback on how tight your pelvic floor muscles are so that you can learn to control them. Massage therapy. A treatment that involves electrical stimulation of the pelvic floor muscles to help control pain (transcutaneous electrical nerve stimulation, or TENS). Sound wave therapy (ultrasound) to reduce muscle spasms. Medicines, such as: Muscle relaxants. Bladder control medicines. Surgery to reconstruct or support pelvic floor muscles may be an option if other treatments do not help. Follow these instructions at home: Activity Do your usual activities as told by your health care provider. Ask your health care provider if you should modify any activities. Do pelvic floor strengthening or relaxing exercises at home as told by your physical therapist. Lifestyle Maintain a healthy weight. Eat foods that are high in fiber, such as beans, whole grains, and fresh fruits and vegetables. Limit foods that are high in fat and processed sugars, such as fried or sweet foods. Manage stress with relaxation techniques such as yoga or meditation. General instructions If you have problems with leakage:  Use  absorbable pads or wear padded underwear. Wash your genital and anal area frequently with mild soap. Keep your genital and anal area as clean and dry as possible. Ask your health care provider if you should try a barrier cream to prevent skin irritation. Take warm baths to relieve pelvic muscle tension or spasms. Take over-the-counter and prescription medicines only as told by your health care provider. Keep all follow-up visits. How is this prevented? The cause of PFD is not always known, but there are a few things you can do to reduce the risk of developing this condition, including: Staying at a healthy weight. Getting regular exercise. Managing stress. Contact a health care provider if: Your symptoms are not improving with home care. You have signs or symptoms of PFD that get worse. You develop new signs or symptoms. You have signs of a urinary tract infection, such as: Fever. Chills. Increased urinary frequency. A burning feeling when urinating. You have not had a bowel movement in 3 days (constipation). Summary Pelvic floor dysfunction results when the muscles and connective tissues in your pelvic floor do not work well. These muscles and their connections form a sling that supports your colon and bladder. In men, these muscles also support the prostate gland. PFD may be caused by an injury to the pelvic area or by a weakening of pelvic muscles. PFD causes pelvic floor muscles to be too weak, too tight, or a combination of both. Symptoms may vary from person to person. In most cases, PFD can be treated with physical therapies and medicines. Surgery may be an option if other treatments do not help. This information is not intended to replace advice given to you by your health care provider. Make sure you discuss any questions you have with your health care provider. Document Revised: 02/17/2021 Document Reviewed: 02/17/2021 Elsevier Patient Education  2024 [employer].  I  appreciate the opportunity to care for you. Quentin Mulling PA-C

## 2023-08-25 ENCOUNTER — Other Ambulatory Visit: Payer: Self-pay | Admitting: Urology

## 2023-08-25 DIAGNOSIS — R102 Pelvic and perineal pain: Secondary | ICD-10-CM

## 2023-08-28 ENCOUNTER — Other Ambulatory Visit: Payer: Self-pay

## 2023-08-28 DIAGNOSIS — R102 Pelvic and perineal pain: Secondary | ICD-10-CM

## 2023-08-28 MED ORDER — AMITRIPTYLINE HCL 10 MG PO TABS: 10.0000 mg | ORAL_TABLET | Freq: Every day | ORAL | 0 refills | Status: DC

## 2023-09-06 ENCOUNTER — Encounter: Payer: Self-pay | Admitting: Gastroenterology

## 2023-09-06 DIAGNOSIS — K219 Gastro-esophageal reflux disease without esophagitis: Secondary | ICD-10-CM

## 2023-09-06 DIAGNOSIS — R1030 Lower abdominal pain, unspecified: Secondary | ICD-10-CM

## 2023-09-07 MED ORDER — NA SULFATE-K SULFATE-MG SULF 17.5-3.13-1.6 GM/177ML PO SOLN: ORAL | 0 refills | Status: DC

## 2023-09-07 MED ORDER — OMEPRAZOLE 40 MG PO CPDR: 40.0000 mg | DELAYED_RELEASE_CAPSULE | Freq: Every day | ORAL | 0 refills | Status: DC

## 2023-09-07 NOTE — Telephone Encounter (Signed)
From: Benancio Deeds, MD Sent: 09/07/2023  12:36 PM EST To: Richardson Chiquito, RN Subject: RE: GI problems                                I reviewed his chart. I think okay to do EGD and colonoscopy to further evaluate his symptoms. Yield of colonoscopy will likely be low given his last exam was not that long ago but given his persistent symptoms we can offer him both. Can you help book him for this in the [facility]? May be reasonable to start omeprazole 40mg  / day for the next 30 days to see if that helps his upper abdomen / GERD otherwise while we await to do this. Thanks

## 2023-09-07 NOTE — Telephone Encounter (Signed)
I have spoken to patient to advise of response and recommendations as per Dr Adela Lank. [name] has scheduled endoscopy and colonoscopy for 10/12/23. Patient has been advised of time/date/location of upcoming procedure and has been given generalized verbal prep instructions. Also advised that a care partner 18 years or older should bring him, stay for the procedure and drive home due to sedation. Written instructions have been made available to the patient for additional review.   In addition, patient is advised that we recommend he begin omeprazole 40 mg once daily x 30 day trial to see if this helps his symptoms. He is in agreement with this plan. Rx sent to pharmacy.

## 2023-09-24 ENCOUNTER — Other Ambulatory Visit: Payer: Self-pay | Admitting: Urology

## 2023-09-24 DIAGNOSIS — R102 Pelvic and perineal pain: Secondary | ICD-10-CM

## 2023-09-27 ENCOUNTER — Encounter: Payer: Self-pay | Admitting: *Deleted

## 2023-09-29 ENCOUNTER — Other Ambulatory Visit: Payer: Self-pay | Admitting: Gastroenterology

## 2023-10-02 ENCOUNTER — Encounter: Payer: Self-pay | Admitting: Gastroenterology

## 2023-10-04 ENCOUNTER — Telehealth: Payer: Self-pay | Admitting: Cardiology

## 2023-10-04 NOTE — Telephone Encounter (Signed)
Caller was patient's spouse and she wanted to get an appointment for herself after ED visit. Creating encounter in caller's chart

## 2023-10-04 NOTE — Telephone Encounter (Signed)
Pt's wife requesting a c/b regarding appt. Please advise

## 2023-10-12 ENCOUNTER — Ambulatory Visit: Payer: PPO | Admitting: Gastroenterology

## 2023-10-12 ENCOUNTER — Encounter: Payer: Self-pay | Admitting: Gastroenterology

## 2023-10-12 VITALS — BP 144/74 | HR 69 | Temp 97.9°F | Resp 14 | Ht 70.0 in | Wt 203.0 lb

## 2023-10-12 DIAGNOSIS — K2980 Duodenitis without bleeding: Secondary | ICD-10-CM

## 2023-10-12 DIAGNOSIS — K317 Polyp of stomach and duodenum: Secondary | ICD-10-CM

## 2023-10-12 DIAGNOSIS — K644 Residual hemorrhoidal skin tags: Secondary | ICD-10-CM | POA: Diagnosis not present

## 2023-10-12 DIAGNOSIS — K3189 Other diseases of stomach and duodenum: Secondary | ICD-10-CM | POA: Diagnosis not present

## 2023-10-12 DIAGNOSIS — K648 Other hemorrhoids: Secondary | ICD-10-CM

## 2023-10-12 DIAGNOSIS — K2950 Unspecified chronic gastritis without bleeding: Secondary | ICD-10-CM

## 2023-10-12 DIAGNOSIS — K5730 Diverticulosis of large intestine without perforation or abscess without bleeding: Secondary | ICD-10-CM

## 2023-10-12 DIAGNOSIS — K31A19 Gastric intestinal metaplasia without dysplasia, unspecified site: Secondary | ICD-10-CM | POA: Diagnosis not present

## 2023-10-12 DIAGNOSIS — D120 Benign neoplasm of cecum: Secondary | ICD-10-CM

## 2023-10-12 DIAGNOSIS — R1030 Lower abdominal pain, unspecified: Secondary | ICD-10-CM

## 2023-10-12 DIAGNOSIS — Z860101 Personal history of adenomatous and serrated colon polyps: Secondary | ICD-10-CM

## 2023-10-12 DIAGNOSIS — K2289 Other specified disease of esophagus: Secondary | ICD-10-CM

## 2023-10-12 DIAGNOSIS — K319 Disease of stomach and duodenum, unspecified: Secondary | ICD-10-CM

## 2023-10-12 DIAGNOSIS — R1010 Upper abdominal pain, unspecified: Secondary | ICD-10-CM

## 2023-10-12 DIAGNOSIS — K449 Diaphragmatic hernia without obstruction or gangrene: Secondary | ICD-10-CM

## 2023-10-12 MED ORDER — SODIUM CHLORIDE 0.9 % IV SOLN: 500.0000 mL | Freq: Once | INTRAVENOUS | Status: DC

## 2023-10-12 NOTE — Progress Notes (Signed)
Called to room to assist during endoscopic procedure.  Patient ID and intended procedure confirmed with present staff. Received instructions for my participation in the procedure from the performing physician.  

## 2023-10-12 NOTE — Op Note (Signed)
[facility] Patient Name: [name] Procedure Date: 10/12/2023 3:25 PM MRN: [id number] Endoscopist: Viviann Spare P. Adela Lank , MD, [id number] Age: 77 Referring MD:  Date of Birth: Sep 28, 1946 Gender: Male Account #: [id number] Procedure:                Upper GI endoscopy Indications:              Upper to mid abdominal pain - trial of omeprazole                            has not provided any significant relief. No prior                            endoscopy. CT scan negative. Symptoms intermittent,                            no prandial association, in fact feels better after                            eating. Medicines:                Monitored Anesthesia Care Procedure:                Pre-Anesthesia Assessment:                           - Prior to the procedure, a History and Physical                            was performed, and patient medications and                            allergies were reviewed. The patient's tolerance of                            previous anesthesia was also reviewed. The risks                            and benefits of the procedure and the sedation                            options and risks were discussed with the patient.                            All questions were answered, and informed consent                            was obtained. Prior Anticoagulants: The patient has                            taken no anticoagulant or antiplatelet agents. ASA                            Grade Assessment: II - A patient with mild systemic  disease. After reviewing the risks and benefits,                            the patient was deemed in satisfactory condition to                            undergo the procedure.                           After obtaining informed consent, the endoscope was                            passed under direct vision. Throughout the                            procedure, the patient's blood pressure,  pulse, and                            oxygen saturations were monitored continuously. The                            GIF [id number] #[id number] was introduced through the                            mouth, and advanced to the second part of duodenum.                            The upper GI endoscopy was accomplished without                            difficulty. The patient tolerated the procedure                            well. Scope In: Scope Out: Findings:                 Esophagogastric landmarks were identified: the                            Z-line was found at 35 cm, the gastroesophageal                            junction was found at 35 cm and the upper extent of                            the gastric folds was found at 40 cm from the                            incisors.                           A 5 cm hiatal hernia was present.                           The Z-line was slightly irregular but  did not meet                            criteria for Barrett's.                           The exam of the esophagus was otherwise normal.                           A single 3 mm sessile polyp was found in the                            cardia, in the proximal aspect of the hernia sac.                            The polyp was removed with a cold biopsy forceps.                            Resection and retrieval were complete.                           The exam of the stomach was otherwise normal.                           Biopsies were taken with a cold forceps for                            Helicobacter pylori testing.                           Congested mucosa was found in the second portion of                            the duodenum. Biopsies were taken with a cold                            forceps for histology.                           Diffuse mucosal changes characterized by nodularity                            were found in the duodenal bulb. The bulb felt                             somewhat restricted. I suspect this is benign                            ectopic gastric mucosa diffusely, but biopsies were                            taken with a cold forceps for histology to ensure  no adenomatous changes.                           The exam of the duodenum was otherwise normal. Complications:            No immediate complications. Estimated blood loss:                            Minimal. Estimated Blood Loss:     Estimated blood loss was minimal. Impression:               - Esophagogastric landmarks identified.                           - 5 cm hiatal hernia.                           - Z-line irregular but did not meet criteria for                            Barrett's.                           - Normal esophagus otherwise.                           - A single gastric polyp in the proximal hernia                            sac. Resected and retrieved.                           - Normal stomach otherwise - biopsies taken to rule                            out H pylori                           - Congested duodenal mucosa. Biopsied.                           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                            Biopsied.                           - Normal duodenum otherwise.                           No overt cause for symptoms on this exam. If PPI                            has helped, can continue it. If PPI has not helped,                            can stop it. Recommendation:           -  Patient has a contact number available for                            emergencies. The signs and symptoms of potential                            delayed complications were discussed with the                            patient. Return to normal activities tomorrow.                            Written discharge instructions were provided to the                            patient.                           - Resume previous diet.                            - Continue present medications.                           - Await pathology results. Viviann Spare P. Keevan Wolz, MD 10/12/2023 4:18:37 PM This report has been signed electronically.

## 2023-10-12 NOTE — Op Note (Signed)
[facility] Patient Name: [name] Procedure Date: 10/12/2023 3:21 PM MRN: [id number] Endoscopist: Viviann Spare P. Adela Lank , MD, [id number] Age: 77 Referring MD:  Date of Birth: Sep 28, 1946 Gender: Male Account #: [id number] Procedure:                Colonoscopy Indications:              Lower abdominal pain - CT scan negative, mother had                            colon cancer / carcinoid, last exam 11/2018 -                            adenoma removed Medicines:                Monitored Anesthesia Care Procedure:                Pre-Anesthesia Assessment:                           - Prior to the procedure, a History and Physical                            was performed, and patient medications and                            allergies were reviewed. The patient's tolerance of                            previous anesthesia was also reviewed. The risks                            and benefits of the procedure and the sedation                            options and risks were discussed with the patient.                            All questions were answered, and informed consent                            was obtained. Prior Anticoagulants: The patient has                            taken no anticoagulant or antiplatelet agents. ASA                            Grade Assessment: II - A patient with mild systemic                            disease. After reviewing the risks and benefits,                            the patient was deemed in satisfactory condition to  undergo the procedure.                           After obtaining informed consent, the colonoscope                            was passed under direct vision. Throughout the                            procedure, the patient's blood pressure, pulse, and                            oxygen saturations were monitored continuously. The                            CF [id number] #[id number] was introduced through  the anus                            and advanced to the the terminal ileum, with                            identification of the appendiceal orifice and IC                            valve. The colonoscopy was performed without                            difficulty. The patient tolerated the procedure                            well. The quality of the bowel preparation was                            good. The terminal ileum, ileocecal valve,                            appendiceal orifice, and rectum were photographed. Scope In: 3:51:14 PM Scope Out: 4:05:28 PM Scope Withdrawal Time: 0 hours 11 minutes 38 seconds  Total Procedure Duration: 0 hours 14 minutes 14 seconds  Findings:                 Skin tags were found on perianal exam.                           The terminal ileum appeared normal.                           A diminutive polyp was found in the cecum. The                            polyp was sessile. The polyp was removed with a                            cold snare. Resection and retrieval were complete.  Multiple small-mouthed diverticula were found in                            the sigmoid colon.                           Internal hemorrhoids were found during retroflexion.                           The exam was otherwise without abnormality. Complications:            No immediate complications. Estimated blood loss:                            Minimal. Estimated Blood Loss:     Estimated blood loss was minimal. Impression:               - Perianal skin tags found on perianal exam.                           - The examined portion of the ileum was normal.                           - One diminutive polyp in the cecum, removed with a                            cold snare. Resected and retrieved.                           - Diverticulosis in the sigmoid colon.                           - Internal hemorrhoids.                           - The examination  was otherwise normal.                           No cause for abdominal pain on this exam or CT                            scan. Could be musculoskeletal in etiology, will                            discuss with the patient / family. Recommendation:           - Patient has a contact number available for                            emergencies. The signs and symptoms of potential                            delayed complications were discussed with the                            patient. Return to normal activities tomorrow.  Written discharge instructions were provided to the                            patient.                           - Resume previous diet.                           - Continue present medications.                           - Await pathology results. Viviann Spare P. Amariana Mirando, MD 10/12/2023 4:10:09 PM This report has been signed electronically.

## 2023-10-12 NOTE — Patient Instructions (Signed)
Handouts given on polyps and diverticulosis.  YOU HAD AN ENDOSCOPIC PROCEDURE TODAY AT THE  ENDOSCOPY CENTER:   Refer to the procedure report that was given to you for any specific questions about what was found during the examination.  If the procedure report does not answer your questions, please call your gastroenterologist to clarify.  If you requested that your care partner not be given the details of your procedure findings, then the procedure report has been included in a sealed envelope for you to review at your convenience later.  YOU SHOULD EXPECT: Some feelings of bloating in the abdomen. Passage of more gas than usual.  Walking can help get rid of the air that was put into your GI tract during the procedure and reduce the bloating. If you had a lower endoscopy (such as a colonoscopy or flexible sigmoidoscopy) you may notice spotting of blood in your stool or on the toilet paper. If you underwent a bowel prep for your procedure, you may not have a normal bowel movement for a few days.  Please Note:  You might notice some irritation and congestion in your nose or some drainage.  This is from the oxygen used during your procedure.  There is no need for concern and it should clear up in a day or so.  SYMPTOMS TO REPORT IMMEDIATELY:  Following lower endoscopy (colonoscopy or flexible sigmoidoscopy):  Excessive amounts of blood in the stool  Significant tenderness or worsening of abdominal pains  Swelling of the abdomen that is new, acute  Fever of 100F or higher  Following upper endoscopy (EGD)  Vomiting of blood or coffee ground material  New chest pain or pain under the shoulder blades  Painful or persistently difficult swallowing  New shortness of breath  Fever of 100F or higher  Black, tarry-looking stools  For urgent or emergent issues, a gastroenterologist can be reached at any hour by calling [phone]. Do not use MyChart messaging for urgent concerns.    DIET:   We do recommend a small meal at first, but then you may proceed to your regular diet.  Drink plenty of fluids but you should avoid alcoholic beverages for 24 hours.  ACTIVITY:  You should plan to take it easy for the rest of today and you should NOT DRIVE or use heavy machinery until tomorrow (because of the sedation medicines used during the test).    FOLLOW UP: Our staff will call the number listed on your records the next business day following your procedure.  We will call around 7:15- 8:00 am to check on you and address any questions or concerns that you may have regarding the information given to you following your procedure. If we do not reach you, we will leave a message.     If any biopsies were taken you will be contacted by phone or by letter within the next 1-3 weeks.  Please call us at [phone] if you have not heard about the biopsies in 3 weeks.    SIGNATURES/CONFIDENTIALITY: You and/or your care partner have signed paperwork which will be entered into your electronic medical record.  These signatures attest to the fact that that the information above on your After Visit Summary has been reviewed and is understood.  Full responsibility of the confidentiality of this discharge information lies with you and/or your care-partner.

## 2023-10-12 NOTE — Progress Notes (Signed)
[facility] History and Physical   Primary Care Physician:  Dorothey Baseman, MD   Reason for Procedure:   Abdominal pain upper and lower abdomen  Plan:    EGD and colonoscopy     HPI: [name] is a 77 y.o. male  here for EGD and colonoscopy to evaluate abdominal pain. Both pains in upper and lower abdomen. CT scan negative. Last colonoscopy 11/2018 with one small adenoma. Mother had carcinoid dx age 37s. Started omeprazole 40mg  / day for a trial.   There are no triggers to his symptoms. Eating sometimes makes it better. NO association with his bowel habits. No positional change. He has been dealing with some back pain lately.    Otherwise feels well without any cardiopulmonary symptoms.  He denies complaints today.  I have discussed risks / benefits of anesthesia and endoscopic procedure with Dawayne Cirri Sur and they wish to proceed with the exams as outlined today.    Past Medical History:  Diagnosis Date   Actinic keratosis    Allergy    Arrhythmia    New problem within past 3 months   Arthritis    Basal cell carcinoma 08/12/2020   L post lat base of neck    BCC (basal cell carcinoma of skin) 02/17/2021   L infra auricular, exc 03/02/21   History of colon polyps    Hyperlipidemia    Hypertension    Melanoma (HCC) 2009   left top of shoulder. Dr. Orson Aloe   Seasonal allergies    Squamous cell carcinoma of skin 11/02/2017   left temple anterior to sideburn. [facility] 11/02/2017    Past Surgical History:  Procedure Laterality Date   COLONOSCOPY     EXERCISE TREADMILL STRESS TEST  08/22/2022   Dr. Harl Bowie: Exercised for 10 minutes.  Stopped due to dyspnea and tiredness/fatigue.  No chest pain.  PVC/aberrant beats noted during stress test.  No anginal pain or EKG changes.   HOLTER MONITOR  07/2022   No evidence of VT.  HR range 42 over 104 bpm.  Average 61 bpm.  Rare PACs and PVCs.  Some bigeminy.  No sustained arrhythmias.   LEFT HEART CATH AND CORONARY  ANGIOGRAPHY N/A 09/20/2022   Procedure: LEFT HEART CATH AND CORONARY ANGIOGRAPHY;  Surgeon: Marykay Lex, MD;  Location: [facility] INVASIVE CV LAB;  Service: Cardiovascular'; Minimal CAD -essentially angiographically normal right dominant system.  Due to tortuous innominate artery, recommend either leftr adial or femoral access if catheterization considered in the future.   MELANOMA EXCISION  2009   POLYPECTOMY     TOTAL KNEE ARTHROPLASTY Right 2011   TOTAL KNEE ARTHROPLASTY Left    TRANSTHORACIC ECHOCARDIOGRAM  08/22/2022   Normal LV size and function.  EF 55%.  No WMA.  No effusion.  Normal valves    Prior to Admission medications   Medication Sig Start Date End Date Taking? Authorizing Provider  amLODipine (NORVASC) 5 MG tablet TAKE 1 TABLET BY MOUTH EVERY DAY 07/14/22  Yes Masoud, Renda Rolls, MD  aspirin EC 81 MG tablet Take 1 tablet (81 mg total) by mouth daily. Swallow whole. 09/14/22  Yes Marykay Lex, MD  losartan (COZAAR) 100 MG tablet Take 1 tablet (100 mg total) by mouth daily. 08/24/22  Yes Masoud, Renda Rolls, MD  metoprolol succinate (TOPROL-XL) 50 MG 24 hr tablet Take 1 tablet (50 mg total) by mouth daily. Take with or immediately following a meal. 08/17/23 11/15/23 Yes Marykay Lex, MD  metoprolol tartrate (LOPRESSOR) 25  MG tablet Take 0.5 tablets (12.5 mg total) by mouth as needed (palpitations). 08/17/23 08/16/24 Yes Marykay Lex, MD  Milk Thistle 250 MG CAPS Take 500 mg by mouth 2 (two) times daily.   Yes [provider]  Multiple Vitamins-Minerals (CENTRUM SILVER PO) Take 1 tablet by mouth daily.   Yes [provider]  omeprazole (PRILOSEC) 40 MG capsule TAKE 1 CAPSULE (40 MG TOTAL) BY MOUTH DAILY. 09/29/23  Yes Eliseo Withers, Willaim Rayas, MD  rosuvastatin (CRESTOR) 10 MG tablet TAKE 1 TABLET BY MOUTH EVERY DAY 07/04/22  Yes Masoud, Renda Rolls, MD  amitriptyline (ELAVIL) 10 MG tablet Take 1 tablet (10 mg total) by mouth daily. Patient not taking: Reported on 10/12/2023  08/28/23   Riki Altes, MD  amoxicillin (AMOXIL) 500 MG tablet Take 2,000 mg by mouth once. 1 hour prior to Dental procedure    [provider]  methocarbamol (ROBAXIN) 750 MG tablet take 1 tablet by oral route 3 times every day PRN muscle spasms Patient not taking: Reported on 08/22/2023 06/23/23   [provider]  Psyllium (METAMUCIL PO) Take 2 scoop by mouth at bedtime.    [provider]    Current Outpatient Medications  Medication Sig Dispense Refill   amLODipine (NORVASC) 5 MG tablet TAKE 1 TABLET BY MOUTH EVERY DAY 90 tablet 3   aspirin EC 81 MG tablet Take 1 tablet (81 mg total) by mouth daily. Swallow whole. 90 tablet 3   losartan (COZAAR) 100 MG tablet Take 1 tablet (100 mg total) by mouth daily. 90 tablet 1   metoprolol succinate (TOPROL-XL) 50 MG 24 hr tablet Take 1 tablet (50 mg total) by mouth daily. Take with or immediately following a meal. 90 tablet 3   metoprolol tartrate (LOPRESSOR) 25 MG tablet Take 0.5 tablets (12.5 mg total) by mouth as needed (palpitations).     Milk Thistle 250 MG CAPS Take 500 mg by mouth 2 (two) times daily.     Multiple Vitamins-Minerals (CENTRUM SILVER PO) Take 1 tablet by mouth daily.     omeprazole (PRILOSEC) 40 MG capsule TAKE 1 CAPSULE (40 MG TOTAL) BY MOUTH DAILY. 90 capsule 1   rosuvastatin (CRESTOR) 10 MG tablet TAKE 1 TABLET BY MOUTH EVERY DAY 90 tablet 2   amitriptyline (ELAVIL) 10 MG tablet Take 1 tablet (10 mg total) by mouth daily. (Patient not taking: Reported on 10/12/2023) 30 tablet 0   amoxicillin (AMOXIL) 500 MG tablet Take 2,000 mg by mouth once. 1 hour prior to Dental procedure     methocarbamol (ROBAXIN) 750 MG tablet take 1 tablet by oral route 3 times every day PRN muscle spasms (Patient not taking: Reported on 08/22/2023)     Psyllium (METAMUCIL PO) Take 2 scoop by mouth at bedtime.     Current Facility-Administered Medications  Medication Dose Route Frequency Provider Last Rate Last Admin    0.9 %  sodium chloride infusion  500 mL Intravenous Once Aadan Chenier, Willaim Rayas, MD        Allergies as of 10/12/2023 - Review Complete 10/12/2023  Allergen Reaction Noted   Atorvastatin Other (See Comments) 09/14/2022    Family History  Problem Relation Age of Onset   Colon cancer Mother        died age 60-dx'd late 7-0's   Hypertension Mother    Arrhythmia Father        Died of heart attack when he was 25   Heart attack Father    Arrhythmia Paternal Uncle  Deceased with heart disease   Heart attack Paternal Uncle    Heart disease Paternal Uncle    Esophageal cancer Neg Hx    Rectal cancer Neg Hx    Stomach cancer Neg Hx    Crohn's disease Neg Hx     Social History   Socioeconomic History   Marital status: Married    Spouse name: Not on file   Number of children: Not on file   Years of education: Not on file   Highest education level: Not on file  Occupational History   Not on file  Tobacco Use   Smoking status: Former    Current packs/day: 0.00    Average packs/day: 0.3 packs/day for 10.0 years (2.5 ttl pk-yrs)    Types: Cigarettes    Start date: 10/24/1962    Quit date: 10/24/1972    Years since quitting: 51.0   Smokeless tobacco: Former    Quit date: 10/24/2002  Vaping Use   Vaping status: Never Used  Substance and Sexual Activity   Alcohol use: Not Currently    Alcohol/week: 3.0 standard drinks of alcohol    Types: 3 Glasses of wine per week    Comment: occasional wine & beer   Drug use: No   Sexual activity: Not Currently    Birth control/protection: None  Other Topics Concern   Not on file  Social History Narrative   Not on file   Social Drivers of Health   Financial Resource Strain: Patient Declined (07/26/2023)   Received from [facility]   Overall Financial Resource Strain (CARDIA)    Difficulty of Paying Living Expenses: Patient declined  Food Insecurity: Patient Declined (07/26/2023)   [facility] from [facility]   Hunger Vital Sign    Worried About Running Out of Food in the Last Year: Patient declined    Ran Out of Food in the Last Year: Patient declined  Transportation Needs: Patient Declined (07/26/2023)   Received from [facility]   PRAPARE - Transportation    In the past 12 months, has lack of transportation kept you from medical appointments or from getting medications?: Patient declined    Lack of Transportation (Non-Medical): Patient declined  Physical Activity: Not on file  Stress: Not on file  Social Connections: Not on file  Intimate Partner Violence: Not on file    Review of Systems: All other review of systems negative except as mentioned in the HPI.  Physical Exam: Vital signs BP (!) 140/77   Pulse 82   Temp 97.9 F (36.6 C)   Ht 5\' 10"  (1.778 m)   Wt 203 lb (92.1 kg)   SpO2 98%   BMI 29.13 kg/m   General:   Alert,  Well-developed, pleasant and cooperative in NAD Lungs:  Clear throughout to auscultation.   Heart:  Regular rate and rhythm Abdomen:  Soft, nontender and nondistended.   Neuro/Psych:  Alert and cooperative. Normal mood and affect. A and O x 3  Harlin Rain, MD [facility]

## 2023-10-12 NOTE — Progress Notes (Signed)
Sedate, gd SR, tolerated procedure well, VSS, report to RN 

## 2023-10-13 ENCOUNTER — Telehealth: Payer: Self-pay | Admitting: *Deleted

## 2023-10-13 NOTE — Telephone Encounter (Signed)
  Follow up Call-     10/12/2023    2:37 PM  Call back number  Post procedure Call Back phone  # [phone]  Permission to leave phone message Yes   [facility]

## 2023-10-17 LAB — SURGICAL PATHOLOGY

## 2023-10-20 ENCOUNTER — Encounter: Payer: Self-pay | Admitting: Gastroenterology

## 2023-10-20 DIAGNOSIS — R1010 Upper abdominal pain, unspecified: Secondary | ICD-10-CM

## 2023-11-02 ENCOUNTER — Ambulatory Visit: Payer: PPO | Attending: Cardiology | Admitting: Cardiology

## 2023-11-02 ENCOUNTER — Encounter: Payer: Self-pay | Admitting: Cardiology

## 2023-11-02 VITALS — BP 130/70 | HR 60 | Ht 71.0 in | Wt 204.1 lb

## 2023-11-02 DIAGNOSIS — E785 Hyperlipidemia, unspecified: Secondary | ICD-10-CM

## 2023-11-02 DIAGNOSIS — I10 Essential (primary) hypertension: Secondary | ICD-10-CM

## 2023-11-02 DIAGNOSIS — I493 Ventricular premature depolarization: Secondary | ICD-10-CM | POA: Diagnosis not present

## 2023-11-02 NOTE — Assessment & Plan Note (Signed)
 Stable on current regimen of Metoprolol  50mg  daily.  No reported chest pain, shortness of breath, or palpitations. EKG shows slower heart rate with no extra beats. -Continue Metoprolol  50mg  daily. - okay to take additional 1/2 tablet as needed for recurrent palpitations.

## 2023-11-02 NOTE — Assessment & Plan Note (Signed)
 BP well-controlled on current medications.  Stable on current dose of losartan, amlodipine and metoprolol.  Using beta-blocker over diuretic due to his palpitations.

## 2023-11-02 NOTE — Assessment & Plan Note (Signed)
 Remains on Crestor.  With most recent LDL of 82.  Given relatively minimal disease noted on cardiac cath, premature goal. . Continue current dose of Crestor 10 mg daily.

## 2023-11-02 NOTE — Patient Instructions (Addendum)
 Medication Instructions:  - No changes *If you need a refill on your cardiac medications before your next appointment, please call your pharmacy*  Lab Work: - None ordered  Testing/Procedures: - None ordered  Follow-Up: At [facility], you and your health needs are our priority.  As part of our continuing mission to provide you with exceptional heart care, we have created designated Provider Care Teams.  These Care Teams include your primary Cardiologist (physician) and Advanced Practice Providers (APPs -  Physician Assistants and Nurse Practitioners) who all work together to provide you with the care you need, when you need it.  Your next appointment:   6 month(s)  Provider:   You may see Alm Clay, MD or one of the following Advanced Practice Providers on your designated Care Team:   Lonni Meager, NP Bernardino Bring, PA-C Cadence Franchester, PA-C Tylene Lunch, NP Barnie Hila, NP    Other Instructions - Can take extra 1/2 tablet of metoprolol  succinate (TOPROL -XL) 50 MG 24 hr tablet for palpitations

## 2023-11-02 NOTE — Progress Notes (Signed)
 Cardiology Office Note:  .   Date:  11/02/2023  ID:  [name], DOB 04-18-1946, MRN [id number] PCP: Glover Lenis, MD  [facility] Providers Cardiologist:  Lenis Clay, MD     Chief Complaint  Patient presents with   3 month follow up     Doing well.     Patient Profile: .     [name] is a  78 y.o. male with a PMH notable for HTN, HLD and symptomatic PVCs who presents here for  3-57-month f/u at the request of Glover Lenis, MD.    [name] was previously seen back in November 2023 for symptoms somewhat concerning for angina as well as symptomatic PVCs.  He underwent cardiac catheterization due to abnormal EKG during stress test.  In November 2023 which showed normal coronaries.  Therapy associated with PVCs being treated with metoprolol  started in December 2023.  He was seen on 03/16/2023 as a work in appointment having called and a week before noting joint pain related to metoprolol ; reduced to BID 25 mg.    [name] was last seen on 06/29/2023 for close follow-up discussed treatment for palpitations.  He had restarted 1/2 tablet Toprol  50 mg daily but noted symptoms were definitely worse in the evening.  I recommended that he take full tablet in the evening and take half tablet in the morning.  Plan was to reassess in 6 months.  Also recommended to use additional dose as needed.  BP is little elevated today resolved anxious.  Plan to reassess.  Subjective  Discussed the use of AI scribe software for clinical note transcription with the patient, who gave verbal consent to proceed.  History of Present Illness   The patient, a 78 year old with a history of HTN & symptomatic PVCs, reports feeling well and has not experienced any health issues since the last visit. He is currently taking a daily dose of 50mg  Metoprolol  at night without the daytime 1/2 tab, along with losartan  100 mg & amlodipine  5 mg for BP & Crestor  10 mg daily for HLD .  Het denies any  symptoms of chest pain, shortness of breath, or stroke-like symptoms. He also denies any leg swelling or shortness of breath. The patient is active, spending approximately eight hours a day outside. He has a history of two knee replacements, which occasionally cause stiffness and restrict certain activities. The patient's wife recently experienced an episode of tachycardia but is reportedly doing better.     Cardiovascular ROS: no chest pain or dyspnea on exertion negative for - edema, orthopnea, paroxysmal nocturnal dyspnea, rapid heart rate, shortness of breath, or syncope/near syncope or TIA/amaurosis fugax, claudication. Very rare, fleeting palpitations.  Nothing consistent with before.  Minimal.  ROS:  Review of Systems - Negative except normal usual aches and pains.    Objective   Current Meds  Medication Sig   amLODipine  (NORVASC ) 5 MG tablet TAKE 1 TABLET BY MOUTH EVERY DAY   aspirin  EC 81 MG tablet Take 1 tablet (81 mg total) by mouth daily. Swallow whole.   losartan  (COZAAR ) 100 MG tablet Take 1 tablet (100 mg total) by mouth daily.   metoprolol  succinate (TOPROL -XL) 50 MG 24 hr tablet Take 1 tablet (50 mg total) by mouth daily. Take with or immediately following a meal.   Milk Thistle 250 MG CAPS Take 500 mg by mouth 2 (two) times daily.   Multiple Vitamins-Minerals (CENTRUM SILVER PO) Take 1 tablet by mouth daily.  omeprazole  (PRILOSEC) 40 MG capsule TAKE 1 CAPSULE (40 MG TOTAL) BY MOUTH DAILY.   Psyllium (METAMUCIL PO) Take 2 scoop by mouth at bedtime.   rosuvastatin  (CRESTOR ) 10 MG tablet TAKE 1 TABLET BY MOUTH EVERY DAY   Studies Reviewed: [name]   EKG Interpretation Date/Time:  Thursday November 02 2023 08:03:48 EST Ventricular Rate:  60 PR Interval:  176 QRS Duration:  78 QT Interval:  394 QTC Calculation: 394 R Axis:   -35  Text Interpretation: Normal sinus rhythm Left axis deviation When compared with ECG of 16-Aug-2022 12:32, Premature ventricular complexes are no  longer Present Vent. rate has decreased BY  37 BPM Inferior infarct is now Present Confirmed by Anner Lenis ([id number]) on 11/02/2023 8:26:47 AM      EXERCISE TREADMILL STRESS TEST 08/22/2022    Dr. Delena: Exercised for 10 minutes.  Stopped due to dyspnea and tiredness/fatigue.  No chest pain.  PVC/aberrant beats noted during stress test.  No anginal pain or EKG changes. => Referred for cath   HOLTER MONITOR 07/2022    No evidence of VT.  HR range 42 over 104 bpm.  Average 61 bpm.  Rare PACs and PVCs.  Some bigeminy.  No sustained arrhythmias.   LEFT HEART CATH AND CORONARY ANGIOGRAPHY 09/20/2022    Procedure: LEFT HEART CATH AND CORONARY ANGIOGRAPHY;  Surgeon: Anner Lenis ORN, MD;  Location: [facility] INVASIVE CV LAB;  Service: Cardiovascular'; Minimal CAD -essentially angiographically normal right dominant system.  Due to tortuous innominate artery, recommend either leftr adial or femoral access if catheterization considered in the future.      TRANSTHORACIC ECHOCARDIOGRAM   08/22/2022    Normal LV size and function.  EF 55%.  No WMA.  No effusion.  Normal valves   Labs from 07/26/23 Cholesterol, Total 100 - 200 mg/dL 841  Triglyceride 35 - 199 mg/dL 80  HDL (High Density Lipoprotein) Cholesterol 29.0 - 71.0 mg/dL 39.7  LDL Calculated 0 - 130 mg/dL 82   Glucose 70 - 889 mg/dL 91  Sodium 863 - 854 mmol/L 142  Potassium 3.6 - 5.1 mmol/L 4.3  Chloride 97 - 109 mmol/L 104  Carbon Dioxide (CO2) 22.0 - 32.0 mmol/L 30.9  Urea Nitrogen (BUN) 7 - 25 mg/dL 16  Creatinine 0.7 - 1.3 mg/dL 0.9  Glomerular Filtration Rate (eGFR) >60 mL/min/1.73sq m 89   Calcium  8.7 - 10.3 mg/dL 9.3  AST 8 - 39 U/L 25  ALT 6 - 57 U/L 26  Alk Phos (alkaline Phosphatase) 34 - 104 U/L 48  Albumin 3.5 - 4.8 g/dL 4.3  Bilirubin, Total 0.3 - 1.2 mg/dL 1.1    Risk Assessment/Calculations:              Physical Exam:   VS:  BP 130/70 (BP Location: Left Arm, Patient Position: Sitting, Cuff Size: Normal)    Pulse 60   Ht 5' 11 (1.803 m)   Wt 204 lb 2 oz (92.6 kg)   SpO2 96%   BMI 28.47 kg/m    Wt Readings from Last 3 Encounters:  11/02/23 204 lb 2 oz (92.6 kg)  10/12/23 203 lb (92.1 kg)  08/22/23 204 lb 2 oz (92.6 kg)    GEN: Well nourished, well developed in no acute distress; anxious NECK: No JVD; No carotid bruits CARDIAC:  RRR, Normal S1, S2; no murmurs, rubs, gallops RESPIRATORY:  Clear to auscultation without rales, wheezing or rhonchi ; nonlabored, good air movement. ABDOMEN: Soft, non-tender, non-distended EXTREMITIES:  No edema; No deformity  ASSESSMENT AND PLAN: .    Problem List Items Addressed This Visit       Cardiology Problems   Essential hypertension (Chronic)   BP well-controlled on current medications.  Stable on current dose of losartan , amlodipine  and metoprolol .  Using beta-blocker over diuretic due to his palpitations.      Relevant Orders   EKG 12-Lead (Completed)   Symptomatic PVCs - Primary (Chronic)   Stable on current regimen of Metoprolol  50mg  daily.  No reported chest pain, shortness of breath, or palpitations. EKG shows slower heart rate with no extra beats. -Continue Metoprolol  50mg  daily. - okay to take additional 1/2 tablet as needed for recurrent palpitations.      Relevant Orders   EKG 12-Lead (Completed)     Other   Dyslipidemia (Chronic)   Remains on Crestor .  With most recent LDL of 82.  Given relatively minimal disease noted on cardiac cath, premature goal. . Continue current dose of Crestor  10 mg daily.            Follow-Up: Return in about 6 months (around 05/01/2024) for 6 month follow-up with me, Follow-up in [place].-Check back in 6 months or sooner if symptoms occur.      Signed, Alm MICAEL Clay, MD, MS Alm Clay, M.D., M.S. Interventional Cardiologist  [facility]   [address]; [address] [place], [place]  [address] [phone]           Fax [phone]

## 2023-11-03 ENCOUNTER — Ambulatory Visit
Admission: RE | Admit: 2023-11-03 | Discharge: 2023-11-03 | Disposition: A | Discharge status: Home / Self Care | Payer: PPO | Source: Ambulatory Visit | Attending: Gastroenterology

## 2023-11-03 DIAGNOSIS — K7689 Other specified diseases of liver: Secondary | ICD-10-CM | POA: Diagnosis not present

## 2023-11-03 DIAGNOSIS — R1010 Upper abdominal pain, unspecified: Secondary | ICD-10-CM

## 2023-11-03 DIAGNOSIS — R1011 Right upper quadrant pain: Secondary | ICD-10-CM | POA: Diagnosis not present

## 2023-11-07 ENCOUNTER — Ambulatory Visit: Payer: PPO | Admitting: Physician Assistant

## 2023-12-04 NOTE — Progress Notes (Signed)
HPI :  78 year old male here for a follow-up visit for abdominal pain.  He was seen by our office in October with abdominal pain.  It led to a workup with CT scan, EGD, colonoscopy, and ultrasound as outlined below.  He is here to follow up in regards to the results and his symptoms.  He is accompanied by his wife today.  I reviewed his history at length for his abdominal pain.  It has been ongoing now for 5 to 6 months or so.  He will feel discomfort in his upper abdomen as well as his lower abdomen/pelvis area.  States it comes and goes but typically he will feel it about two thirds of each day where bothersome.  He can have pain in both areas at the same time, or pain in 1 area and not the other, it can fluctuate.  He has no triggers for these pains that he can think of.  He denies that eating makes his symptoms any worse, him in fact he thinks sometimes perhaps it makes him feel better?  He describes it as a lingering dull pain.  It does not really radiate anywhere.  It is not changed or relieved with bowel movements.  He is extremely active and works in his yard frequently.  He finds that lying down tends to relieve his discomfort.  He drinks anywhere from 1-4 drinks of alcohol per day.  Typically 2 beers at a time 1 or 2 glasses of wine.  He does not think drinking alcohol makes him any worse.  He has been taking Metamucil for years, takes multiple doses per day to keep regular bowel stools.  He denies any blood in his stools or problems with his bowels.  Denies feeling bloated or gassy but does state his abdomen feels distended.  He does have low back pain with disc bulge on MRI of the lumbar spine but states his belly pain is present when his back pain is not.  We gave him a good trial of omeprazole twice daily.  He states that has helped some occasional heartburn he has but has not made any difference to his abdominal pain.  He was placed on Elavil 10 mg nightly, he thinks he was told he was  to take this for his urinary issues, he is not sure exactly why he is on it but states it has not helped his pain at all.  He is eating okay, no nausea or vomiting.  He did have a CT scan in September for this particular pain, did not show any clear cause for his symptoms.  He underwent an EGD and colonoscopy with me in December, results as outlined below.  EGD showed a hiatal hernia, small gastric polyp.  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  Colonoscopy showed diverticulosis with a diminutive polyp removed.  No clear cause for his symptoms on this exam.  He then had a read for quadrant ultrasound which showed no gallstones.  Biliary tree appears normal.  Fatty liver noted, benign hepatic cysts.  No cirrhosis.  He is essentially unchanged and symptoms appear to be bothering him more frequently than usual.  He has a protuberant abdomen and wears a tight fitting belt to keep his jeans on.  He states his tenderness is right above the belt line.    11/2018 colonoscopy diminutive adenomatous polyp and another small hyperplastic polyp, moderate size internal hemorrhoids  recall 11/2025  07/19/2023 CT abdomen pelvis with contrast for pelvic pain showed no acute findings, nonobstructing right renal calculus, numerous bilateral renal cysts mildly enlarged no specific follow-up, possible right sided scrotal hydrocele, aortic atherosclerosis.  Normal stomach normal bowels normal appendix.  Mild diverticulosis no diverticulitis.  Normal liver other than to simple cyst, normal gallbladder, pancreas and spleen.  07/26/2023 labs reviewed show no anemia no leukocytosis     EGD 10/12/23: - Esophagogastric landmarks were identified: the Z-line was found at 35 cm, the gastroesophageal junction was found at 35 cm and the upper extent of the gastric folds was found at 40 cm from the incisors. Findings: - A 5 cm hiatal hernia  was present. - The Z-line was slightly irregular but did not meet criteria for Barrett's. - The exam of the esophagus was otherwise normal. - A single 3 mm sessile polyp was found in the cardia, in the proximal aspect of the hernia sac. The polyp was removed with a cold biopsy forceps. Resection and retrieval were complete. - The exam of the stomach was otherwise normal. - Biopsies were taken with a cold forceps for Helicobacter pylori testing. - Congested mucosa was found in the second portion of the duodenum. Biopsies were taken with a cold forceps for histology. - Diffuse mucosal changes characterized by nodularity were found in the duodenal bulb. The bulb felt somewhat restricted. I suspect this is benign ectopic gastric mucosa diffusely, but biopsies were taken with a cold forceps for histology to ensure no adenomatous changes. - The exam of the duodenum was otherwise normal.  Colonoscopy 10/12/23: - Skin tags were found on perianal exam. - The terminal ileum appeared normal. - A diminutive polyp was found in the cecum. The polyp was sessile. The polyp was removed with a cold snare. Resection and retrieval were complete. 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 - Internal hemorrhoids were found during retroflexion. - The exam was otherwise without abnormality.  1. Surgical [P], duodenal biopsies :      REACTIVE DUODENAL AND MUCOSA WITH GASTRIC METAPLASIA COMPATIBLE PEPTIC      DUODENITIS       2. Surgical [P], duodenal bulb :      BENIGN GASTRIC HETEROTOPIA       3. Surgical [P], gastric antrum and gastric body :      MINIMAL CHRONIC GASTRITIS      NEGATIVE FOR H. PYLORI, INTESTINAL METAPLASIA, DYSPLASIA AND CARCINOMA       4. Surgical [P], gastric polyp biopsies :      HYPERPLASTIC POLYP      MILD CHRONIC GASTRITIS OF FOVEOLAR HYPERPLASIA      NEGATIVE FOR H. PYLORI, INTESTINAL METAPLASIA, DYSPLASIA AND CARCINOMA       5. Surgical [P], colon, cecum, polyp (1)  :      BENIGN COLONIC MUCOSA WITH PROMINENT LYMPHOID AGGREGATE     RUQ [place] 11/03/23: IMPRESSION: 1. No acute abnormality identified. 2. Increased echotexture of the liver. This is a nonspecific finding but can be seen in fatty infiltration of liver.   Past Medical History:  Diagnosis Date   Actinic keratosis    Allergy    Arrhythmia    New problem within past 3 months   Arthritis    Basal cell carcinoma 08/12/2020   L post lat base of neck    BCC (basal cell carcinoma of skin) 02/17/2021   L infra auricular, exc 03/02/21   History of colon polyps  Hyperlipidemia    Hypertension    Melanoma (HCC) 2009   left top of shoulder. Dr. Orson Aloe   Seasonal allergies    Squamous cell carcinoma of skin 11/02/2017   left temple anterior to sideburn. [facility] 11/02/2017     Past Surgical History:  Procedure Laterality Date   COLONOSCOPY     EXERCISE TREADMILL STRESS TEST  08/22/2022   Dr. Harl Bowie: Exercised for 10 minutes.  Stopped due to dyspnea and tiredness/fatigue.  No chest pain.  PVC/aberrant beats noted during stress test.  No anginal pain or EKG changes.   HOLTER MONITOR  07/2022   No evidence of VT.  HR range 42 over 104 bpm.  Average 61 bpm.  Rare PACs and PVCs.  Some bigeminy.  No sustained arrhythmias.   LEFT HEART CATH AND CORONARY ANGIOGRAPHY N/A 09/20/2022   Procedure: LEFT HEART CATH AND CORONARY ANGIOGRAPHY;  Surgeon: Marykay Lex, MD;  Location: [facility] INVASIVE CV LAB;  Service: Cardiovascular'; Minimal CAD -essentially angiographically normal right dominant system.  Due to tortuous innominate artery, recommend either leftr adial or femoral access if catheterization considered in the future.   MELANOMA EXCISION  2009   POLYPECTOMY     TOTAL KNEE ARTHROPLASTY Right 2011   TOTAL KNEE ARTHROPLASTY Left    TRANSTHORACIC ECHOCARDIOGRAM  08/22/2022   Normal LV size and function.  EF 55%.  No WMA.  No effusion.  Normal valves   Family History  Problem Relation Age of Onset    Colon cancer Mother        died age 74-dx'd late 7-0's   Hypertension Mother    Arrhythmia Father        Died of heart attack when he was 7   Heart attack Father    Arrhythmia Paternal Uncle        Deceased with heart disease   Heart attack Paternal Uncle    Heart disease Paternal Uncle    Esophageal cancer Neg Hx    Rectal cancer Neg Hx    Stomach cancer Neg Hx    Crohn's disease Neg Hx    Social History   Tobacco Use   Smoking status: Former    Current packs/day: 0.00    Average packs/day: 0.3 packs/day for 10.0 years (2.5 ttl pk-yrs)    Types: Cigarettes    Start date: 10/24/1962    Quit date: 10/24/1972    Years since quitting: 51.1   Smokeless tobacco: Former    Quit date: 10/24/2002  Vaping Use   Vaping status: Never Used  Substance Use Topics   Alcohol use: Not Currently    Alcohol/week: 3.0 standard drinks of alcohol    Types: 3 Glasses of wine per week    Comment: occasional wine & beer   Drug use: No   Current Outpatient Medications  Medication Sig Dispense Refill   amLODipine (NORVASC) 5 MG tablet TAKE 1 TABLET BY MOUTH EVERY DAY 90 tablet 3   aspirin EC 81 MG tablet Take 1 tablet (81 mg total) by mouth daily. Swallow whole. 90 tablet 3   DULoxetine (CYMBALTA) 30 MG capsule Take 1 capsule (30 mg total) by mouth daily. May increase to 2 capsules (60 mg) once daily if tolerated 30 capsule 3   losartan (COZAAR) 100 MG tablet Take 1 tablet (100 mg total) by mouth daily. 90 tablet 1   methylcellulose (CITRUCEL) oral powder Take 1 packet by mouth daily.     Milk Thistle 250 MG CAPS Take 500 mg by  mouth 2 (two) times daily.     Multiple Vitamins-Minerals (CENTRUM SILVER PO) Take 1 tablet by mouth daily.     omeprazole (PRILOSEC) 40 MG capsule TAKE 1 CAPSULE (40 MG TOTAL) BY MOUTH DAILY. 90 capsule 1   rosuvastatin (CRESTOR) 10 MG tablet TAKE 1 TABLET BY MOUTH EVERY DAY 90 tablet 2   metoprolol succinate (TOPROL-XL) 50 MG 24 hr tablet Take 1 tablet (50 mg total) by  mouth daily. Take with or immediately following a meal. 90 tablet 3   No current facility-administered medications for this visit.   Allergies  Allergen Reactions   Atorvastatin Other (See Comments)    Muscle pain     Review of Systems: All systems reviewed and negative except where noted in HPI.    Physical Exam: BP 124/70   Pulse 70   Ht 5\' 11"  (1.803 m)   Wt 206 lb (93.4 kg)   BMI 28.73 kg/m  Constitutional: Pleasant,well-developed, male in no acute distress. Abdominal: Soft, protuberant, nontender. Negative carnett sign. There are no masses palpable.  Extremities: no edema Lymphadenopathy: No cervical adenopathy noted. Neurological: Alert and oriented to person place and time. Skin: Skin is warm and dry. No rashes noted. Psychiatric: Normal mood and affect. Behavior is normal.   ASSESSMENT: 78 y.o. male here for assessment of the following  1. Abdominal discomfort   2. Gastroesophageal reflux disease without esophagitis   3. Fatty liver   4. Alcohol use    Extensive evaluation with negative CT scan, EGD, colonoscopy, right upper quadrant ultrasound.  He has discomfort in both his upper and lower abdomen.  There is some positional component to this.  He is wearing a rather tight fitting belt underneath a protuberant abdomen/pannus.  I suspect this is neuropathic/musculoskeletal, at least in his lower abdomen, this could be related to his belt.  I recommend he avoid wearing the belt and wear suspenders to keep up his pants or loosefitting close such as sweatpants etc. to see if this will provide any benefit.  He is willing to try this.  He does have significant back pain and sometimes we discussed abdominal wall pain can be related to back pain although in this case I am not so sure.  I will check baseline labs to make sure no interval changes, they were normal the last time they were checked.  We discussed other options that could help with musculoskeletal pain.  Elavil is  not helping much and at such a low dose I would stop it.  I would like to transition him to Cymbalta 30 mg daily for 1 month and then can increase to 60 mg if tolerated and it is helping.  We discussed other things he is taking.  He is taking an awful lot of Metamucil, that can often cause abdominal bloating and distention, would recommend he hold that in light of his abdominal symptoms and switch to Citrucel which should not cause gas and bloating.  Omeprazole has not provided any benefit to his abdominal pain at twice daily dosing but it has helped his reflux.  Would taper him down to daily dosing at this point and then take intermittently as needed moving forward for reflux.  We did discuss the fatty liver that he has on imaging.  He is drinking upwards of 4 alcoholic beverages per day which could be the cause of this.  I recommend he minimize his alcohol use, counseled him on risks of cirrhosis and fibrosis moving forward.  He will  try to reduce his alcohol intake and work on weight loss.  Will trend LFTs yearly.   PLAN: - avoid tight fitting belt, wear loose fitting clothes, try suspenders - lab for CBC and CMET - stop Elavil - start Cymbalta 30mg  q day for 1 month and increase PRN - stop metamucil (in case causing bloating) - start Citrucel daily - reduce omeprazole to q day and then taper down as tolerated - reduce alcohol intake, counseled on fatty liver - call if no better in a few weeks  Harlin Rain, MD [facility]

## 2023-12-05 ENCOUNTER — Encounter: Payer: Self-pay | Admitting: Gastroenterology

## 2023-12-05 ENCOUNTER — Other Ambulatory Visit (INDEPENDENT_AMBULATORY_CARE_PROVIDER_SITE_OTHER): Payer: PPO

## 2023-12-05 ENCOUNTER — Ambulatory Visit (INDEPENDENT_AMBULATORY_CARE_PROVIDER_SITE_OTHER): Payer: PPO | Admitting: Gastroenterology

## 2023-12-05 VITALS — BP 124/70 | HR 70 | Ht 71.0 in | Wt 206.0 lb

## 2023-12-05 DIAGNOSIS — Z789 Other specified health status: Secondary | ICD-10-CM | POA: Diagnosis not present

## 2023-12-05 DIAGNOSIS — K760 Fatty (change of) liver, not elsewhere classified: Secondary | ICD-10-CM

## 2023-12-05 DIAGNOSIS — F1090 Alcohol use, unspecified, uncomplicated: Secondary | ICD-10-CM

## 2023-12-05 DIAGNOSIS — K219 Gastro-esophageal reflux disease without esophagitis: Secondary | ICD-10-CM

## 2023-12-05 DIAGNOSIS — R109 Unspecified abdominal pain: Secondary | ICD-10-CM | POA: Diagnosis not present

## 2023-12-05 LAB — CBC WITH DIFFERENTIAL/PLATELET
Basophils Absolute: 0 10*3/uL (ref 0.0–0.1)
Basophils Relative: 0.7 % (ref 0.0–3.0)
Eosinophils Absolute: 0.1 10*3/uL (ref 0.0–0.7)
Eosinophils Relative: 1.9 % (ref 0.0–5.0)
HCT: 44.4 % (ref 39.0–52.0)
Hemoglobin: 14.7 g/dL (ref 13.0–17.0)
Lymphocytes Relative: 33.7 % (ref 12.0–46.0)
Lymphs Abs: 2.1 10*3/uL (ref 0.7–4.0)
MCHC: 33 g/dL (ref 30.0–36.0)
MCV: 94.3 fL (ref 78.0–100.0)
Monocytes Absolute: 0.4 10*3/uL (ref 0.1–1.0)
Monocytes Relative: 7.3 % (ref 3.0–12.0)
Neutro Abs: 3.5 10*3/uL (ref 1.4–7.7)
Neutrophils Relative %: 56.4 % (ref 43.0–77.0)
Platelets: 213 10*3/uL (ref 150.0–400.0)
RBC: 4.71 Mil/uL (ref 4.22–5.81)
RDW: 14.6 % (ref 11.5–15.5)
WBC: 6.1 10*3/uL (ref 4.0–10.5)

## 2023-12-05 LAB — COMPREHENSIVE METABOLIC PANEL
ALT: 26 U/L (ref 0–53)
AST: 23 U/L (ref 0–37)
Albumin: 4.4 g/dL (ref 3.5–5.2)
Alkaline Phosphatase: 58 U/L (ref 39–117)
BUN: 13 mg/dL (ref 6–23)
CO2: 31 meq/L (ref 19–32)
Calcium: 9.1 mg/dL (ref 8.4–10.5)
Chloride: 102 meq/L (ref 96–112)
Creatinine, Ser: 0.99 mg/dL (ref 0.40–1.50)
GFR: 74.59 mL/min (ref >60.00)
Glucose, Bld: 109 mg/dL — ABNORMAL HIGH (ref 70–99)
Potassium: 4.4 meq/L (ref 3.5–5.1)
Sodium: 139 meq/L (ref 135–145)
Total Bilirubin: 0.9 mg/dL (ref 0.2–1.2)
Total Protein: 7.2 g/dL (ref 6.0–8.3)

## 2023-12-05 MED ORDER — CITRUCEL PO POWD: 1.0000 | Freq: Every day | ORAL | Status: AC

## 2023-12-05 MED ORDER — DULOXETINE HCL 30 MG PO CPEP: 30.0000 mg | ORAL_CAPSULE | Freq: Every day | ORAL | 3 refills | Status: DC

## 2023-12-05 NOTE — Patient Instructions (Addendum)
Wear loose fitting clothing.  Stop Elavil and Metamucil.  Please purchase the following medications over the counter and take as directed: Citrucel: Use as directed once daily  We have sent the following medications to your pharmacy for you to pick up at your convenience: Cymbalta 30 mg: Take once daily for 30 days. May increase to 2 capsules daily if tolerated  Decrease omeprazole to once daily and then taper off as tolerated.  Reduce your alcohol intake.  Please go to the lab in the basement of our building to have lab work done as you leave today. Hit "B" for basement when you get on the elevator.  When the doors open the lab is on your left.  We will call you with the results. Thank you.   Thank you for entrusting me with your care and for choosing [facility], Dr. Ileene Patrick

## 2023-12-07 ENCOUNTER — Encounter: Payer: Self-pay | Admitting: Gastroenterology

## 2023-12-11 ENCOUNTER — Other Ambulatory Visit: Payer: Self-pay

## 2023-12-11 MED ORDER — OMEPRAZOLE 40 MG PO CPDR: 40.0000 mg | DELAYED_RELEASE_CAPSULE | Freq: Every day | ORAL | 1 refills | Status: AC

## 2023-12-11 NOTE — Progress Notes (Signed)
 Refill request for omeprazole sent to pharmacy

## 2023-12-14 ENCOUNTER — Other Ambulatory Visit: Payer: Self-pay | Admitting: Gastroenterology

## 2024-01-31 DIAGNOSIS — Z0000 Encounter for general adult medical examination without abnormal findings: Secondary | ICD-10-CM | POA: Diagnosis not present

## 2024-03-20 ENCOUNTER — Ambulatory Visit: Payer: PPO | Admitting: Dermatology

## 2024-03-20 ENCOUNTER — Encounter: Payer: Self-pay | Admitting: Dermatology

## 2024-03-20 DIAGNOSIS — Z7189 Other specified counseling: Secondary | ICD-10-CM

## 2024-03-20 DIAGNOSIS — L570 Actinic keratosis: Secondary | ICD-10-CM | POA: Diagnosis not present

## 2024-03-20 DIAGNOSIS — D1801 Hemangioma of skin and subcutaneous tissue: Secondary | ICD-10-CM | POA: Diagnosis not present

## 2024-03-20 DIAGNOSIS — Z1283 Encounter for screening for malignant neoplasm of skin: Secondary | ICD-10-CM

## 2024-03-20 DIAGNOSIS — L814 Other melanin hyperpigmentation: Secondary | ICD-10-CM

## 2024-03-20 DIAGNOSIS — L820 Inflamed seborrheic keratosis: Secondary | ICD-10-CM | POA: Diagnosis not present

## 2024-03-20 DIAGNOSIS — L821 Other seborrheic keratosis: Secondary | ICD-10-CM | POA: Diagnosis not present

## 2024-03-20 DIAGNOSIS — W908XXA Exposure to other nonionizing radiation, initial encounter: Secondary | ICD-10-CM | POA: Diagnosis not present

## 2024-03-20 DIAGNOSIS — D229 Melanocytic nevi, unspecified: Secondary | ICD-10-CM

## 2024-03-20 DIAGNOSIS — Z85820 Personal history of malignant melanoma of skin: Secondary | ICD-10-CM | POA: Diagnosis not present

## 2024-03-20 DIAGNOSIS — Z872 Personal history of diseases of the skin and subcutaneous tissue: Secondary | ICD-10-CM | POA: Diagnosis not present

## 2024-03-20 DIAGNOSIS — Z8589 Personal history of malignant neoplasm of other organs and systems: Secondary | ICD-10-CM

## 2024-03-20 DIAGNOSIS — Z85828 Personal history of other malignant neoplasm of skin: Secondary | ICD-10-CM

## 2024-03-20 DIAGNOSIS — L578 Other skin changes due to chronic exposure to nonionizing radiation: Secondary | ICD-10-CM | POA: Diagnosis not present

## 2024-03-20 DIAGNOSIS — Z79899 Other long term (current) drug therapy: Secondary | ICD-10-CM

## 2024-03-20 DIAGNOSIS — Z5111 Encounter for antineoplastic chemotherapy: Secondary | ICD-10-CM

## 2024-03-20 NOTE — Patient Instructions (Addendum)
 Start July 5 or after  - Start 5-fluorouracil/calcipotriene cream twice a day for 7 days to affected areas including use cream at temples and cheeks . Prescription sent to [facility]. Patient advised they will receive an email to purchase the medication online and have it sent to their home. Patient provided with handout reviewing treatment course and side effects and advised to call or message us  on MyChart with any concerns.  Reviewed course of treatment and expected reaction.  Patient advised to expect inflammation and crusting and advised that erosions are possible.  Patient advised to be diligent with sun protection during and after treatment. Counseled to keep medication out of reach of children and pets.   Instructions for [facility] Medications  One or more of your medications was sent to the [facility] mail order [facility] pharmacy. You will receive an email from them and can purchase the medicine through that link. It will then be mailed to your home at the address you confirmed. If for any reason you do not receive an email from them, please check your spam folder. If you still do not find the email, please let us  know. [facility] phone number is [phone].  5-Fluorouracil/Calcipotriene Patient Education   Actinic keratoses are the dry, red scaly spots on the skin caused by sun damage. A portion of these spots can turn into skin cancer with time, and treating them can help prevent development of skin cancer.   Treatment of these spots requires removal of the defective skin cells. There are various ways to remove actinic keratoses, including freezing with liquid nitrogen, treatment with creams, or treatment with a blue light procedure in the office.   5-fluorouracil cream is a topical cream used to treat actinic keratoses. It works by interfering with the growth of abnormal fast-growing skin cells, such as actinic keratoses. These cells peel off  and are replaced by healthy ones.   5-fluorouracil/calcipotriene is a combination of the 5-fluorouracil cream with a vitamin D analog cream called calcipotriene. The calcipotriene alone does not treat actinic keratoses. However, when it is combined with 5-fluorouracil, it helps the 5-fluorouracil treat the actinic keratoses much faster so that the same results can be achieved with a much shorter treatment time.  INSTRUCTIONS FOR 5-FLUOROURACIL/CALCIPOTRIENE CREAM:   5-fluorouracil/calcipotriene cream typically only needs to be used for 4-7 days. A thin layer should be applied twice a day to the treatment areas recommended by your physician.   If your physician prescribed you separate tubes of 5-fluourouracil and calcipotriene, apply a thin layer of 5-fluorouracil followed by a thin layer of calcipotriene.   Avoid contact with your eyes, nostrils, and mouth. Do not use 5-fluorouracil/calcipotriene cream on infected or open wounds.   You will develop redness, irritation and some crusting at areas where you have pre-cancer damage/actinic keratoses. IF YOU DEVELOP PAIN, BLEEDING, OR SIGNIFICANT CRUSTING, STOP THE TREATMENT EARLY - you have already gotten a good response and the actinic keratoses should clear up well.  Wash your hands after applying 5-fluorouracil 5% cream on your skin.   A moisturizer or sunscreen with a minimum SPF 30 should be applied each morning.   Once you have finished the treatment, you can apply a thin layer of Vaseline twice a day to irritated areas to soothe and calm the areas more quickly. If you experience significant discomfort, contact your physician.  For some patients it is necessary to repeat the treatment for best results.  SIDE EFFECTS: When using 5-fluorouracil/calcipotriene  cream, you may have mild irritation, such as redness, dryness, swelling, or a mild burning sensation. This usually resolves within 2 weeks. The more actinic keratoses you have, the more  redness and inflammation you can expect during treatment. Eye irritation has been reported rarely. If this occurs, please let us  know.  If you have any trouble using this cream, please call the office. If you have any other questions about this information, please do not hesitate to ask me before you leave the office.  Seborrheic Keratosis  What causes seborrheic keratoses? Seborrheic keratoses are harmless, common skin growths that first appear during adult life.  As time goes by, more growths appear.  Some people may develop a large number of them.  Seborrheic keratoses appear on both covered and uncovered body parts.  They are not caused by sunlight.  The tendency to develop seborrheic keratoses can be inherited.  They vary in color from skin-colored to gray, brown, or even black.  They can be either smooth or have a rough, warty surface.   Seborrheic keratoses are superficial and look as if they were stuck on the skin.  Under the microscope this type of keratosis looks like layers upon layers of skin.  That is why at times the top layer may seem to fall off, but the rest of the growth remains and re-grows.    Treatment Seborrheic keratoses do not need to be treated, but can easily be removed in the office.  Seborrheic keratoses often cause symptoms when they rub on clothing or jewelry.  Lesions can be in the way of shaving.  If they become inflamed, they can cause itching, soreness, or burning.  Removal of a seborrheic keratosis can be accomplished by freezing, burning, or surgery. If any spot bleeds, scabs, or grows rapidly, please return to have it checked, as these can be an indication of a skin cancer.   Cryotherapy Aftercare  Wash gently with soap and water everyday.   Apply Vaseline and Band-Aid daily until healed.   Actinic keratoses are precancerous spots that appear secondary to cumulative UV radiation exposure/sun exposure over time. They are chronic with expected duration over 1  year. A portion of actinic keratoses will progress to squamous cell carcinoma of the skin. It is not possible to reliably predict which spots will progress to skin cancer and so treatment is recommended to prevent development of skin cancer.  Recommend daily broad spectrum sunscreen SPF 30+ to sun-exposed areas, reapply every 2 hours as needed.  Recommend staying in the shade or wearing long sleeves, sun glasses (UVA+UVB protection) and wide brim hats (4-inch brim around the entire circumference of the hat). Call for new or changing lesions.   Melanoma ABCDEs  Melanoma is the most dangerous type of skin cancer, and is the leading cause of death from skin disease.  You are more likely to develop melanoma if you: Have light-colored skin, light-colored eyes, or red or blond hair Spend a lot of time in the sun Tan regularly, either outdoors or in a tanning bed Have had blistering sunburns, especially during childhood Have a close family member who has had a melanoma Have atypical moles or large birthmarks  Early detection of melanoma is key since treatment is typically straightforward and cure rates are extremely high if we catch it early.   The first sign of melanoma is often a change in a mole or a new dark spot.  The ABCDE system is a way of remembering the signs of  melanoma.  A for asymmetry:  The two halves do not match. B for border:  The edges of the growth are irregular. C for color:  A mixture of colors are present instead of an even brown color. D for diameter:  Melanomas are usually (but not always) greater than 6mm - the size of a pencil eraser. E for evolution:  The spot keeps changing in size, shape, and color.  Please check your skin once per month between visits. You can use a small mirror in front and a large mirror behind you to keep an eye on the back side or your body.   If you see any new or changing lesions before your next follow-up, please call to schedule a  visit.  Please continue daily skin protection including broad spectrum sunscreen SPF 30+ to sun-exposed areas, reapplying every 2 hours as needed when you're outdoors.   Staying in the shade or wearing long sleeves, sun glasses (UVA+UVB protection) and wide brim hats (4-inch brim around the entire circumference of the hat) are also recommended for sun protection.    Due to recent changes in healthcare laws, you may see results of your pathology and/or laboratory studies on MyChart before the doctors have had a chance to review them. We understand that in some cases there may be results that are confusing or concerning to you. Please understand that not all results are received at the same time and often the doctors may need to interpret multiple results in order to provide you with the best plan of care or course of treatment. Therefore, we ask that you please give us  2 business days to thoroughly review all your results before contacting the office for clarification. Should we see a critical lab result, you will be contacted sooner.   If You Need Anything After Your Visit  If you have any questions or concerns for your doctor, please call our main line at [phone] and press option 4 to reach your doctor's medical assistant. If no one answers, please leave a voicemail as directed and we will return your call as soon as possible. Messages left after 4 pm will be answered the following business day.   You may also send us  a message via MyChart. We typically respond to MyChart messages within 1-2 business days.  For prescription refills, please ask your pharmacy to contact our office. Our fax number is [phone].  If you have an urgent issue when the clinic is closed that cannot wait until the next business day, you can page your doctor at the number below.    Please note that while we do our best to be available for urgent issues outside of office hours, we are not available 24/7.   If you  have an urgent issue and are unable to reach us , you may choose to seek medical care at your doctor's office, retail clinic, urgent care center, or emergency room.  If you have a medical emergency, please immediately call 911 or go to the emergency department.  Pager Numbers  - Dr. Bary Likes: [phone]  - Dr. Annette Barters: [phone]  - Dr. Felipe Horton: [phone]   In the event of inclement weather, please call our main line at [phone] for an update on the status of any delays or closures.  Dermatology Medication Tips: Please keep the boxes that topical medications come in in order to help keep track of the instructions about where and how to use these. Pharmacies typically print the medication instructions only on  the boxes and not directly on the medication tubes.   If your medication is too expensive, please contact our office at [phone] option 4 or send us  a message through MyChart.   We are unable to tell what your co-pay for medications will be in advance as this is different depending on your insurance coverage. However, we may be able to find a substitute medication at lower cost or fill out paperwork to get insurance to cover a needed medication.   If a prior authorization is required to get your medication covered by your insurance company, please allow us  1-2 business days to complete this process.  Drug prices often vary depending on where the prescription is filled and some pharmacies may offer cheaper prices.  The website www.goodrx.com contains coupons for medications through different pharmacies. The prices here do not account for what the cost may be with help from insurance (it may be cheaper with your insurance), but the website can give you the price if you did not use any insurance.  - You can print the associated coupon and take it with your prescription to the pharmacy.  - You may also stop by our office during regular business hours and pick up a GoodRx coupon  card.  - If you need your prescription sent electronically to a different pharmacy, notify our office through [facility] or by phone at [phone] option 4.     Si Usted Necesita Algo Despus de Su Visita  Tambin puede enviarnos un mensaje a travs de Clinical cytogeneticist. Por lo general respondemos a los mensajes de MyChart en el transcurso de 1 a 2 das hbiles.  Para renovar recetas, por favor pida a su farmacia que se ponga en contacto con nuestra oficina. Franz Jacks de fax es [place] [phone].  Si tiene un asunto urgente cuando la clnica est cerrada y que no puede esperar hasta el siguiente da hbil, puede llamar/localizar a su doctor(a) al nmero que aparece a continuacin.   Por favor, tenga en cuenta que aunque hacemos todo lo posible para estar disponibles para asuntos urgentes fuera del horario de [place], no estamos disponibles las 24 horas del da, los 7 [address]  [address] de la [place].   Si tiene un problema urgente y no puede comunicarse con nosotros, puede optar por buscar atencin mdica  en el consultorio de su doctor(a), en una clnica privada, en un centro de atencin urgente o en una sala de emergencias.  Si tiene Engineer, drilling, por favor llame inmediatamente al 911 o vaya a la sala de emergencias.  Nmeros de bper  - Dr. Bary Likes: [phone]  - Dra. Annette Barters: [phone]  - Dr. Felipe Horton: [phone]   En caso de inclemencias del tiempo, por favor llame a Lajuan Pila principal al [phone] para una actualizacin sobre el [place] de cualquier retraso o cierre.  Consejos para la medicacin en dermatologa: Por favor, guarde las cajas en las que vienen los medicamentos de uso tpico para ayudarle a seguir las instrucciones sobre dnde y cmo usarlos. Las farmacias generalmente imprimen las instrucciones del medicamento slo en las cajas y no directamente en los tubos del [place].   Si su medicamento es muy caro, por favor, pngase en contacto con Bettyjane Brunet llamando al [phone] y presione la opcin 4 o envenos un mensaje a travs de Clinical cytogeneticist.   No podemos decirle cul ser su copago por los medicamentos por adelantado ya que esto es diferente dependiendo de la cobertura de su seguro. Sin embargo, es posible  que podamos encontrar un medicamento sustituto a Audiological scientist un formulario para que el seguro cubra el medicamento que se considera necesario.   Si se requiere una autorizacin previa para que su compaa de seguros [place] su medicamento, por favor permtanos de 1 a 2 das hbiles para completar este proceso.  Los precios de los medicamentos varan con frecuencia dependiendo del Environmental consultant de dnde se surte la receta y alguna farmacias pueden ofrecer precios ms baratos.  El sitio web www.goodrx.com tiene cupones para medicamentos de Health and safety inspector. Los precios aqu no tienen en cuenta lo que podra costar con la ayuda del seguro (puede ser ms barato con su seguro), pero el sitio web puede darle el precio si no utiliz Tourist information centre manager.  - Puede imprimir el cupn correspondiente y llevarlo con su receta a la farmacia.  - Tambin puede pasar por nuestra oficina durante el horario de atencin regular y Education officer, museum una tarjeta de cupones de GoodRx.  - Si necesita que su receta se enve electrnicamente a una farmacia diferente, informe a nuestra oficina a travs de MyChart de [place] o por telfono llamando al [phone] y presione la opcin 4.

## 2024-03-20 NOTE — Progress Notes (Signed)
 Follow-Up Visit   Subjective  [name] is a 78 y.o. male who presents for the following: Skin Cancer Screening and Full Body Skin Exam Hx of melanoma, hx of bcc, hx of scc, hx of aks,  Reports some spots over left eye, left side of back, and legs he would like checked.   The patient presents for Total-Body Skin Exam (TBSE) for skin cancer screening and mole check. The patient has spots, moles and lesions to be evaluated, some may be new or changing and the patient may have concern these could be cancer.  The following portions of the chart were reviewed this encounter and updated as appropriate: medications, allergies, medical history  Review of Systems:  No other skin or systemic complaints except as noted in HPI or Assessment and Plan.  Objective  Well appearing patient in no apparent distress; mood and affect are within normal limits.  A full examination was performed including scalp, head, eyes, ears, nose, lips, neck, chest, axillae, abdomen, back, buttocks, bilateral upper extremities, bilateral lower extremities, hands, feet, fingers, toes, fingernails, and toenails. All findings within normal limits unless otherwise noted below.   Relevant physical exam findings are noted in the Assessment and Plan.  face x 15 (15) Erythematous thin papules/macules with gritty scale.  back x 2, left cheek x 1, right elbow x 1, b/l legs x 4 (8) Erythematous stuck-on, waxy papule or plaque  Assessment & Plan   SKIN CANCER SCREENING PERFORMED TODAY.  ACTINIC DAMAGE - Chronic condition, secondary to cumulative UV/sun exposure - diffuse scaly erythematous macules with underlying dyspigmentation - Recommend daily broad spectrum sunscreen SPF 30+ to sun-exposed areas, reapply every 2 hours as needed.  - Staying in the shade or wearing long sleeves, sun glasses (UVA+UVB protection) and wide brim hats (4-inch brim around the entire circumference of the hat) are also recommended for sun  protection.  - Call for new or changing lesions.  LENTIGINES, SEBORRHEIC KERATOSES, HEMANGIOMAS - Benign normal skin lesions - Benign-appearing - Call for any changes  MELANOCYTIC NEVI - Tan-brown and/or pink-flesh-colored symmetric macules and papules - Benign appearing on exam today - Observation - Call clinic for new or changing moles - Recommend daily use of broad spectrum spf 30+ sunscreen to sun-exposed areas.   HISTORY OF MELANOMA. Top of left shoulder. 2009 - No evidence of recurrence today - No lymphadenopathy - Recommend regular full body skin exams - Recommend daily broad spectrum sunscreen SPF 30+ to sun-exposed areas, reapply every 2 hours as needed.  - Call if any new or changing lesions are noted between office visits    HISTORY OF BASAL CELL CARCINOMA OF THE SKIN. 2021 left post lat base of neck , left infra auricular excision 02/2021  - No evidence of recurrence today - Recommend regular full body skin exams - Recommend daily broad spectrum sunscreen SPF 30+ to sun-exposed areas, reapply every 2 hours as needed.  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 Prescription sent to [facility]. Patient advised they will receive an email to purchase the medication online and have it sent to their home. Patient provided with handout reviewing treatment course and side effects and advised to  call or message us  on MyChart with any concerns.  Reviewed course of treatment and expected reaction.  Patient advised to expect inflammation and crusting and advised that erosions are possible.  Patient advised to be diligent with sun protection during and after treatment. Counseled to keep medication out of reach of children and pets.  ACTINIC DAMAGE WITH PRECANCEROUS ACTINIC KERATOSES Counseling for Topical Chemotherapy Management: Patient exhibits: - Severe, confluent actinic changes with pre-cancerous actinic keratoses that is secondary to cumulative UV radiation exposure over time - Condition that is severe; chronic, not at goal. - diffuse scaly erythematous macules and papules with underlying dyspigmentation - Discussed Prescription "Field Treatment" topical Chemotherapy for Severe, Chronic Confluent Actinic Changes with Pre-Cancerous Actinic Keratoses Field treatment involves treatment of an entire area of skin that has confluent Actinic Changes (Sun/ Ultraviolet light damage) and PreCancerous Actinic Keratoses by method of PhotoDynamic Therapy (PDT) and/or prescription Topical Chemotherapy agents such as 5-fluorouracil, 5-fluorouracil/calcipotriene, and/or imiquimod.  The purpose is to decrease the number of clinically evident and subclinical PreCancerous lesions to prevent progression to development of skin cancer by chemically destroying Carlos precancer changes that may or may not be visible.  It has been shown to reduce the risk of developing skin cancer in the treated area. As a result of treatment, redness, scaling, crusting, and open sores may occur during treatment course. One or more than one of these methods may be used and may have to be used several times to control, suppress and eliminate the PreCancerous changes. Discussed treatment course, expected reaction, and possible side effects. - Recommend daily broad spectrum sunscreen SPF 30+ to sun-exposed areas, reapply every 2 hours as  needed.  - Staying in the shade or wearing long sleeves, sun glasses (UVA+UVB protection) and wide brim hats (4-inch brim around the entire circumference of the hat) are also recommended. - Call for new or changing lesions.  Destruction of lesion - face x 15 (15) Complexity: simple   Destruction method: cryotherapy   Informed consent: discussed and consent obtained   Timeout:  patient name, date of birth, surgical site, and procedure verified Lesion destroyed using liquid nitrogen: Yes   Region frozen until ice ball extended beyond lesion: Yes   Outcome: patient tolerated procedure well with no complications   Post-procedure details: wound care instructions given   INFLAMED SEBORRHEIC KERATOSIS (8) back x 2, left cheek x 1, right elbow x 1, b/l legs x 4 (8) Symptomatic, irritating, patient would like treated. Destruction of lesion - back x 2, left cheek x 1, right elbow x 1, b/l legs x 4 (8) Complexity: simple   Destruction method: cryotherapy   Informed consent: discussed and consent obtained   Timeout:  patient name, date of birth, surgical site, and procedure verified Lesion destroyed using liquid nitrogen: Yes   Region frozen until ice ball extended beyond lesion: Yes   Outcome: patient tolerated procedure well with no complications   Post-procedure details: wound care instructions given   Return for November - February tbse .  IRandee Busing, CMA, am acting as scribe for Celine Collard, MD.   Documentation: I have reviewed the above documentation for accuracy and completeness, and I agree with the above.  Celine Collard, MD

## 2024-05-16 DIAGNOSIS — J029 Acute pharyngitis, unspecified: Secondary | ICD-10-CM | POA: Diagnosis not present

## 2024-05-16 DIAGNOSIS — R509 Fever, unspecified: Secondary | ICD-10-CM | POA: Diagnosis not present

## 2024-08-06 ENCOUNTER — Ambulatory Visit

## 2024-08-06 DIAGNOSIS — D1801 Hemangioma of skin and subcutaneous tissue: Secondary | ICD-10-CM | POA: Diagnosis not present

## 2024-08-06 DIAGNOSIS — D229 Melanocytic nevi, unspecified: Secondary | ICD-10-CM

## 2024-08-06 DIAGNOSIS — Z85820 Personal history of malignant melanoma of skin: Secondary | ICD-10-CM

## 2024-08-06 DIAGNOSIS — C4492 Squamous cell carcinoma of skin, unspecified: Secondary | ICD-10-CM

## 2024-08-06 DIAGNOSIS — W908XXA Exposure to other nonionizing radiation, initial encounter: Secondary | ICD-10-CM | POA: Diagnosis not present

## 2024-08-06 DIAGNOSIS — L814 Other melanin hyperpigmentation: Secondary | ICD-10-CM

## 2024-08-06 DIAGNOSIS — C44629 Squamous cell carcinoma of skin of left upper limb, including shoulder: Secondary | ICD-10-CM

## 2024-08-06 DIAGNOSIS — L821 Other seborrheic keratosis: Secondary | ICD-10-CM | POA: Diagnosis not present

## 2024-08-06 DIAGNOSIS — C44622 Squamous cell carcinoma of skin of right upper limb, including shoulder: Secondary | ICD-10-CM | POA: Diagnosis not present

## 2024-08-06 DIAGNOSIS — C4490 Unspecified malignant neoplasm of skin, unspecified: Secondary | ICD-10-CM

## 2024-08-06 DIAGNOSIS — L578 Other skin changes due to chronic exposure to nonionizing radiation: Secondary | ICD-10-CM

## 2024-08-06 DIAGNOSIS — D489 Neoplasm of uncertain behavior, unspecified: Secondary | ICD-10-CM

## 2024-08-06 DIAGNOSIS — Z1283 Encounter for screening for malignant neoplasm of skin: Secondary | ICD-10-CM

## 2024-08-06 HISTORY — DX: Squamous cell carcinoma of skin, unspecified: C44.92

## 2024-08-06 NOTE — Progress Notes (Signed)
 Subjective   [name] is a 77 y.o. male who presents for the following: Lesion(s) of concern . Patient is established patient   Today patient reports: Areas of concern on the left had and right arm.   Review of Systems:    History of melanoma ROS - denies any fevers, chills, weight loss, night sweats, lymphadenopathy, nausea, vomiting, diarrhea, chest pain, shortness of breath, headaches, changes in vision  .  The following portions of the chart were reviewed this encounter and updated as appropriate: medications, allergies, medical history  Relevant Medical History:  Personal history of non melanoma skin cancer - see medical history for full details  and Personal history of melanoma - see medical history for full details   Objective  Well appearing patient in no apparent distress; mood and affect are within normal limits. Examination was performed of the: Sun Exposed Exam: Scalp, head, eyes, ears, nose, lips, neck, upper extremities, hands, fingers, fingernails  Examination notable for: SKIN EXAM, Angioma(s): Scattered red vascular papule(s)  , Lentigo/lentigines: Scattered pigmented macules that are tan to brown in color and are somewhat non-uniform in shape and concentrated in the sun-exposed areas, Nevus/nevi: Scattered well-demarcated, regular, pigmented macule(s) and/or papule(s)  , Seborrheic Keratosis(es): Stuck-on appearing keratotic papule(s) on the trunk, none  irritated with redness, crusting, edema, and/or partial avulsion, Actinic Damage/Elastosis: chronic sun damage: dyspigmentation, telangiectasia, and wrinkling  Examination limited by: Clothing and Patient deferred removal     Left dorsal hand 1.5cm pink plaque   Right Forearm - Posterior 1cm pink plaque    Assessment & Plan   SKIN CANCER SCREENING PERFORMED TODAY.  BENIGN SKIN FINDINGS  - Lentigines  - Seborrheic keratoses  - Hemangiomas   - Nevus/Multiple Benign Nevi  - Reassurance provided regarding  the benign appearance of lesions noted on exam today; no treatment is indicated in the absence of symptoms/changes. - Reinforced importance of photoprotective strategies including liberal and frequent sunscreen use of a broad-spectrum SPF 30 or greater, use of protective clothing, and sun avoidance for prevention of cutaneous malignancy and photoaging.  Counseled patient on the importance of regular self-skin monitoring as well as routine clinical skin examinations as scheduled.  - FBSE w/ Dr Hester 11/06/24   ACTINIC DAMAGE - Chronic condition, secondary to cumulative UV/sun exposure - Recommend daily broad spectrum sunscreen SPF 30+ to sun-exposed areas, reapply every 2 hours as needed.  - Staying in the shade or wearing long sleeves, sun glasses (UVA+UVB protection) and wide brim hats (4-inch brim around the entire circumference of the hat) are also recommended for sun protection.  - Call for new or changing lesions.  Personal history of non melanoma skin cancer  and melanoma - Reviewed medical history for full details  - Reviewed sun protective measures as above - Encouraged full body skin exams     Level of service outlined above   Procedures, orders, diagnosis for this visit:  NEOPLASM OF UNCERTAIN BEHAVIOR (2) Left dorsal hand Skin / nail biopsy Type of biopsy: tangential   Informed consent: discussed and consent obtained   Timeout: patient name, date of birth, surgical site, and procedure verified   Procedure prep:  Patient was prepped and draped in usual sterile fashion Prep type:  Isopropyl alcohol Anesthesia: the lesion was anesthetized in a standard fashion   Anesthetic:  1% lidocaine  w/ epinephrine 1-100,000 buffered w/ 8.4% NaHCO3 Instrument used: DermaBlade   Hemostasis achieved with: pressure and aluminum chloride   Outcome: patient tolerated procedure  well   Post-procedure details: sterile dressing applied and wound care instructions given   Dressing type: bandage  and petrolatum    Right Forearm - Posterior Skin / nail biopsy Type of biopsy: tangential   Informed consent: discussed and consent obtained   Timeout: patient name, date of birth, surgical site, and procedure verified   Procedure prep:  Patient was prepped and draped in usual sterile fashion Prep type:  Isopropyl alcohol Anesthesia: the lesion was anesthetized in a standard fashion   Anesthetic:  1% lidocaine  w/ epinephrine 1-100,000 buffered w/ 8.4% NaHCO3 Instrument used: DermaBlade   Hemostasis achieved with: pressure and aluminum chloride   Outcome: patient tolerated procedure well   Post-procedure details: sterile dressing applied and wound care instructions given   Dressing type: bandage and petrolatum     Neoplasm of uncertain behavior -     Skin / nail biopsy -     Skin / nail biopsy    Return to clinic: Return for Appointment as scheduled.  I, Emerick Ege, CMA am acting as scribe for Lauraine Carlos Kanaris, MD.   Documentation: I have reviewed the above documentation for accuracy and completeness, and I agree with the above.  Lauraine Carlos Kanaris, MD

## 2024-08-06 NOTE — Patient Instructions (Addendum)

## 2024-08-07 ENCOUNTER — Ambulatory Visit: Payer: Self-pay

## 2024-08-07 LAB — SURGICAL PATHOLOGY

## 2024-08-07 NOTE — Telephone Encounter (Signed)
Patient informed of pathology results and appt scheduled.

## 2024-08-07 NOTE — Telephone Encounter (Signed)
-----   Message from [name] sent at 08/07/2024  4:02 PM EDT -----    1. Skin, left dorsal hand :       SQUAMOUS CELL CARCINOMA, KERATOACANTHOMA TYPE        2. Skin, right forearm - posterior :       SQUAMOUS CELL CARCINOMA, KERATOACANTHOMA TYPE   Please notify patient with below plan: ED&C with IL5FU  ----- Message ----- From: Interface, Lab In Three Zero One Sent: 08/07/2024   3:27 PM EDT To: Carlos JAYSON Kanaris, MD

## 2024-08-08 ENCOUNTER — Other Ambulatory Visit: Payer: Self-pay | Admitting: Cardiology

## 2024-08-22 ENCOUNTER — Encounter: Payer: Self-pay | Admitting: Cardiology

## 2024-08-22 ENCOUNTER — Ambulatory Visit: Attending: Cardiology | Admitting: Cardiology

## 2024-08-22 VITALS — BP 135/78 | HR 72 | Ht 72.0 in | Wt 209.4 lb

## 2024-08-22 DIAGNOSIS — I10 Essential (primary) hypertension: Secondary | ICD-10-CM | POA: Diagnosis not present

## 2024-08-22 DIAGNOSIS — R9431 Abnormal electrocardiogram [ECG] [EKG]: Secondary | ICD-10-CM | POA: Diagnosis not present

## 2024-08-22 DIAGNOSIS — I493 Ventricular premature depolarization: Secondary | ICD-10-CM | POA: Diagnosis not present

## 2024-08-22 DIAGNOSIS — E785 Hyperlipidemia, unspecified: Secondary | ICD-10-CM | POA: Diagnosis not present

## 2024-08-22 NOTE — Assessment & Plan Note (Signed)
 Previously symptomatic, now asymptomatic on metoprolol  50 mg daily. - Continue metoprolol  50 mg daily.

## 2024-08-22 NOTE — Progress Notes (Signed)
 Cardiology Office Note:  .   Date:  08/22/2024  ID:  [name], DOB January 11, 1946, MRN [id number] PCP: Glover Lenis, MD  [facility] Providers Cardiologist:  Lenis Clay, MD     Chief Complaint  Patient presents with   Follow-up    Follow up visit. Patient is doing well on today. Meds reviewed.    Hypertension    Stable blood pressure   Palpitations    Well-controlled palpitations    Patient Profile: .     [name] is a 78 y.o. male with a PMH notable for HTN, HLD, and symptomatic PVCs who presents here for 24-month follow-up at the request of Glover Lenis, MD.  [name] was initially seen back in November 2023 for frequent PVCs and symptoms concerning for possible angina.  He underwent cardiac catheterization which showed the angiographically normal coronary arteries.  Beta-blocker dose was increased, however, he then called in noting joint pain in response to beta-blocker.     [name] was last seen on November 02, 2023.  Feeling relatively well with PVCs being controlled.  Taking metoprolol  50 mg p.m. and 25 mg a.m. along with losartan  100 mg daily and amlodipine  5 mg for BP control.  Was also taking Crestor  10 mg for hyperlipidemia.  Subjective  Discussed the use of AI scribe software for clinical note transcription with the patient, who gave verbal consent to proceed.  History of Present Illness [name] is a 78 year old male with a history of palpitations and hypertension who presents for follow-up regarding his cardiac health.  He has not experienced any palpitations since his last visit in January, attributing this improvement to his medication regimen, specifically metoprolol , which he is currently taking at a stable dose of 50 mg once daily. He previously experienced a 'yo-yo effect' with the slow-acting version of metoprolol , but is now taking the long-acting version.  He monitors his blood pressure at home, where it typically runs around  130-135/75-80 mmHg. No dizziness, lightheadedness, chest pain, shortness of breath, or swelling in his legs. He also reports no episodes of syncope or stroke-like symptoms.  His current medications include metoprolol  50 mg daily, amlodipine  5 mg, losartan  100 mg, aspirin  81 mg, Crestor  10 mg, Metamucil, and Cymbalta  30 mg. He is no longer taking Prilosec. Cymbalta  was prescribed for abdominal pain.  He has a history of a cardiac catheterization a few years ago, which did not reveal significant coronary artery disease. His last cholesterol check was in October of the previous year, with results showing total cholesterol of 158 mg/dL, triglycerides 80 mg/dL, HDL 60 mg/dL, and LDL 80 mg/dL.  He mentions a recent emotional impact from the loss of his dog, which affected him significantly. He also notes a recent weight gain and acknowledges that his diet has not been optimal, but he is confident in his ability to lose weight.  He is planning to move to [facility] [place] , to be closer to his family, and is considering finding a new primary care provider in that area.  Cardiovascular ROS: no chest pain or dyspnea on exertion negative for - edema, irregular heartbeat, orthopnea, palpitations, paroxysmal nocturnal dyspnea, rapid heart rate, shortness of breath, or Lightheadedness, dizziness or wooziness, syncope or near syncope, TIA or amaurosis fugax.  Claudication.  ROS:  Review of Systems - complete ROS reviewed:negative    Objective   Current Meds  Medication Sig   amLODipine  (NORVASC ) 5 MG tablet TAKE 1  TABLET BY MOUTH EVERY DAY   aspirin  EC 81 MG tablet Take 1 tablet (81 mg total) by mouth daily. Swallow whole.   DULoxetine  (CYMBALTA ) 30 MG capsule Take 1 capsule (30 mg total) by mouth daily. Take 30 mg daily for 1 month. Then may increase to 60 mg daily thereafter as tolerated   losartan  (COZAAR ) 100 MG tablet Take 1 tablet (100 mg total) by mouth daily.   methylcellulose (CITRUCEL)  oral powder Take 1 packet by mouth daily. (Patient taking differently: Take 1 packet by mouth daily. Metamucil)   metoprolol  succinate (TOPROL -XL) 50 MG 24 hr tablet TAKE 1 TABLET BY MOUTH DAILY. TAKE WITH OR IMMEDIATELY FOLLOWING A MEAL.   Milk Thistle 250 MG CAPS Take 500 mg by mouth 2 (two) times daily.   Multiple Vitamins-Minerals (CENTRUM SILVER PO) Take 1 tablet by mouth daily.   rosuvastatin  (CRESTOR ) 10 MG tablet TAKE 1 TABLET BY MOUTH EVERY DAY    Studies Reviewed: [name]   EKG Interpretation Date/Time:  Thursday August 22 2024 14:33:10 EDT Ventricular Rate:  72 PR Interval:  180 QRS Duration:  82 QT Interval:  382 QTC Calculation: 418 R Axis:   -39  Text Interpretation: Normal sinus rhythm Possible Left atrial enlargement Left axis deviation Inferior infarct (cited on or before 22-Aug-2024) When compared with ECG of 02-Nov-2023 08:03, No significant change was found Confirmed by Anner Lenis ([id number]) on 08/22/2024 3:32:15 PM    Results LABS Total cholesterol: 158 (07/2023) Triglycerides: 80 (07/2023) HDL: 60 (07/2023) LDL: 80 (07/2023)  DIAGNOSTIC EKG: Normal  Lab Results  Component Value Date   NA 139 12/05/2023   K 4.4 12/05/2023   CREATININE 0.99 12/05/2023   GFR 74.59 12/05/2023   GLUCOSE 109 (H) 12/05/2023   Lab Results  Component Value Date   CHOL 162 08/18/2022   HDL 68 08/18/2022   LDLCALC 76 08/18/2022   TRIG 92 08/18/2022   CHOLHDL 2.4 08/18/2022   TRANSTHORACIC ECHOCARDIOGRAM  Normal LV size and function.  EF 55%.  No WMA.  No effusion.  Normal valves 08/22/2022   CATH (November 2023): Angiographically normal coronary arteries.  Right dominant system.  (Recommend femoral or left radial approach for future catheterizations).  Noted frequent PVCs.    Risk Assessment/Calculations:             Physical Exam:   VS:  BP 135/78   Pulse 72   Ht 6' (1.829 m)   Wt 209 lb 6.4 oz (95 kg)   SpO2 98%   BMI 28.40 kg/m    Wt Readings from Last 3  Encounters:  08/22/24 209 lb 6.4 oz (95 kg)  12/05/23 206 lb (93.4 kg)  11/02/23 204 lb 2 oz (92.6 kg)      GEN: Healthy appearing.  Well nourished, well groomed in no acute distress; overweight but not obese by BMI. NECK: No JVD; No carotid bruits CARDIAC: Normal S1, S2; RRR, no murmurs, rubs, gallops RESPIRATORY:  Clear to auscultation without rales, wheezing or rhonchi ; nonlabored, good air movement. ABDOMEN: Soft, non-tender, non-distended EXTREMITIES:  No edema; No deformity      ASSESSMENT AND PLAN: .    Problem List Items Addressed This Visit       Cardiology Problems   Essential hypertension - Primary (Chronic)   Blood pressure slightly elevated at 148/80 mmHg in office, home readings 130-135/75-80 mmHg.  Follow-up blood pressure was slightly better. Weight gain may contribute to elevation. Discussed potential medication adjustments with primary care provider. -  Monitor blood pressure at home and report readings to primary care provider. - Discuss potential medication adjustments with primary care provider if blood pressure remains elevated. Would consider either adding low-dose HCTZ 12.5 mg to the losartan  versus titrate amlodipine  to 10 mg daily - Implement lifestyle changes to reduce weight.      Relevant Orders   EKG 12-Lead (Completed)   Symptomatic PVCs (Chronic)   Previously symptomatic, now asymptomatic on metoprolol  50 mg daily. - Continue metoprolol  50 mg daily.      Relevant Orders   EKG 12-Lead (Completed)     Other   Abnormal ECG during exercise stress test (Chronic)   Evaluated cardiac catheterization, no obstructive CAD.  No active angina now.      Dyslipidemia (Chronic)   Cholesterol levels acceptable last October.  Currently on Crestor  10 mg.  Weight gain may affect levels, potential to increase dosage if necessary. - Continue Crestor  10 mg daily. => Low threshold to titrate up if lipids are not at goal. - Check cholesterol levels with  primary care provider.               Follow-Up: Return in about 1 year (around 08/22/2025) for Routine follow up with me.     Signed, Alm MICAEL Clay, MD, MS Alm Clay, M.D., M.S. Interventional Cardiologist  [facility] Pager # [phone]

## 2024-08-22 NOTE — Patient Instructions (Signed)
 Medication Instructions:  Your physician recommends that you continue on your current medications as directed. Please refer to the Current Medication list given to you today.   *If you need a refill on your cardiac medications before your next appointment, please call your pharmacy*  Lab Work: None ordered at this time   Other information: BP log given  Follow-Up: At [facility], you and your health needs are our priority.  As part of our continuing mission to provide you with exceptional heart care, our providers are all part of one team.  This team includes your primary Cardiologist (physician) and Advanced Practice Providers or APPs (Physician Assistants and Nurse Practitioners) who all work together to provide you with the care you need, when you need it.  Your next appointment:   1 year(s)  Provider:   You may see Alm Clay, MD or one of the following Advanced Practice Providers on your designated Care Team:   Lonni Meager, NP Lesley Maffucci, PA-C Bernardino Bring, PA-C Cadence Carlin, PA-C Tylene Lunch, NP Barnie Hila, NP

## 2024-08-22 NOTE — Assessment & Plan Note (Signed)
 Cholesterol levels acceptable last October.  Currently on Crestor  10 mg.  Weight gain may affect levels, potential to increase dosage if necessary. - Continue Crestor  10 mg daily. => Low threshold to titrate up if lipids are not at goal. - Check cholesterol levels with primary care provider.

## 2024-08-22 NOTE — Assessment & Plan Note (Signed)
 Evaluated cardiac catheterization, no obstructive CAD.  No active angina now.

## 2024-08-22 NOTE — Assessment & Plan Note (Signed)
 Blood pressure slightly elevated at 148/80 mmHg in office, home readings 130-135/75-80 mmHg.  Follow-up blood pressure was slightly better. Weight gain may contribute to elevation. Discussed potential medication adjustments with primary care provider. - Monitor blood pressure at home and report readings to primary care provider. - Discuss potential medication adjustments with primary care provider if blood pressure remains elevated. Would consider either adding low-dose HCTZ 12.5 mg to the losartan  versus titrate amlodipine  to 10 mg daily - Implement lifestyle changes to reduce weight.

## 2024-08-29 ENCOUNTER — Ambulatory Visit: Admitting: Cardiology

## 2024-09-09 ENCOUNTER — Ambulatory Visit

## 2024-09-09 DIAGNOSIS — C44622 Squamous cell carcinoma of skin of right upper limb, including shoulder: Secondary | ICD-10-CM

## 2024-09-09 DIAGNOSIS — C4492 Squamous cell carcinoma of skin, unspecified: Secondary | ICD-10-CM

## 2024-09-09 DIAGNOSIS — C44629 Squamous cell carcinoma of skin of left upper limb, including shoulder: Secondary | ICD-10-CM

## 2024-09-09 DIAGNOSIS — C4441 Basal cell carcinoma of skin of scalp and neck: Secondary | ICD-10-CM | POA: Diagnosis not present

## 2024-09-09 DIAGNOSIS — D492 Neoplasm of unspecified behavior of bone, soft tissue, and skin: Secondary | ICD-10-CM

## 2024-09-09 MED ORDER — FLUOROURACIL CHEMO INJECTION 500 MG/10ML FOR DERMATOLOGY USE
5.0000 mg | Freq: Once | INTRAVENOUS | Status: AC
  Administered 2024-09-09: 5 mg via INTRALESIONAL

## 2024-09-09 NOTE — Patient Instructions (Addendum)
 Electrodesiccation and Curettage ("Scrape and Burn") Wound Care Instructions  Leave the original bandage on for 24 hours if possible.  If the bandage becomes soaked or soiled before that time, it is OK to remove it and examine the wound.  A small amount of post-operative bleeding is normal.  If excessive bleeding occurs, remove the bandage, place gauze over the site and apply continuous pressure (no peeking) over the area for 30 minutes. If this does not work, please call our clinic as soon as possible or page your doctor if it is after hours.   Once a day, cleanse the wound with soap and water. It is fine to shower. If a thick crust develops you may use a Q-tip dipped into dilute hydrogen peroxide (mix 1:1 with water) to dissolve it.  Hydrogen peroxide can slow the healing process, so use it only as needed.    After washing, apply petroleum jelly (Vaseline) or an antibiotic ointment if your doctor prescribed one for you, followed by a bandage.    For best healing, the wound should be covered with a layer of ointment at all times. If you are not able to keep the area covered with a bandage to hold the ointment in place, this may mean re-applying the ointment several times a day.  Continue this wound care until the wound has healed and is no longer open. It may take several weeks for the wound to heal and close.  Itching and mild discomfort is normal during the healing process.  If you have any discomfort, you can take Tylenol  (acetaminophen ) or ibuprofen as directed on the bottle. (Please do not take these if you have an allergy to them or cannot take them for another reason).  Some redness, tenderness and white or yellow material in the wound is normal healing.  If the area becomes very sore and red, or develops a thick yellow-green material (pus), it may be infected; please notify us .    Wound healing continues for up to one year following surgery. It is not unusual to experience pain in the scar  from time to time during the interval.  If the pain becomes severe or the scar thickens, you should notify the office.    A slight amount of redness in a scar is expected for the first six months.  After six months, the redness will fade and the scar will soften and fade.  The color difference becomes less noticeable with time.  If there are any problems, return for a post-op surgery check at your earliest convenience.  To improve the appearance of the scar, you can use silicone scar gel, cream, or sheets (such as Mederma or Serica) every night for up to one year. These are available over the counter (without a prescription).  Please call our office at [phone] for any questions or concerns.   The bandage should remain in place until tomorrow. Remove the bandages and clean the wound once daily as follows:  - Wash your hands. Clean the wound gently with soap and water, and then pat dry. Do not rub. Apply a small amount of Petroleum Jelly or Vaseline. Cover the area with a Band-Aid.  A small amount of bleeding is normal. If bleeding persists, apply firm pressure over the bandage for 5 to 10 minutes without interruption. If bleeding continues, call our office. Continue to clean the area as directed above until the wound is healed. Shave biopsies may take several weeks to heal. It is normal if  the edges are pink/red and the center is slight yellowish or white in color. However if the site becomes hot, swollen, has a thick drainage or redness that expands away from the site please call us . We will contact you with results once available.    Due to recent changes in healthcare laws, you may see results of your pathology and/or laboratory studies on MyChart before the doctors have had a chance to review them. We understand that in some cases there may be results that are confusing or concerning to you. Please understand that not all results are received at the same time and often the doctors may need to  interpret multiple results in order to provide you with the best plan of care or course of treatment. Therefore, we ask that you please give us  2 business days to thoroughly review all your results before contacting the office for clarification. Should we see a critical lab result, you will be contacted sooner.   If You Need Anything After Your Visit  If you have any questions or concerns for your doctor, please call our main line at [phone] and press option 4 to reach your doctor's medical assistant. If no one answers, please leave a voicemail as directed and we will return your call as soon as possible. Messages left after 4 pm will be answered the following business day.   You may also send us  a message via MyChart. We typically respond to MyChart messages within 1-2 business days.  For prescription refills, please ask your pharmacy to contact our office. Our fax number is [phone].  If you have an urgent issue when the clinic is closed that cannot wait until the next business day, you can page your doctor at the number below.    Please note that while we do our best to be available for urgent issues outside of office hours, we are not available 24/7.   If you have an urgent issue and are unable to reach us , you may choose to seek medical care at your doctor's office, retail clinic, urgent care center, or emergency room.  If you have a medical emergency, please immediately call 911 or go to the emergency department.  Pager Numbers  - Dr. Hester: [phone]  - Dr. Jackquline: [phone]  - Dr. Claudene: [phone]   In the event of inclement weather, please call our main line at [phone] for an update on the status of any delays or closures.  Dermatology Medication Tips: Please keep the boxes that topical medications come in in order to help keep track of the instructions about where and how to use these. Pharmacies typically print the medication instructions only on  the boxes and not directly on the medication tubes.   If your medication is too expensive, please contact our office at [phone] option 4 or send us  a message through MyChart.   We are unable to tell what your co-pay for medications will be in advance as this is different depending on your insurance coverage. However, we may be able to find a substitute medication at lower cost or fill out paperwork to get insurance to cover a needed medication.   If a prior authorization is required to get your medication covered by your insurance company, please allow us  1-2 business days to complete this process.  Drug prices often vary depending on where the prescription is filled and some pharmacies may offer cheaper prices.  The website www.goodrx.com contains coupons for medications through different pharmacies. The  prices here do not account for what the cost may be with help from insurance (it may be cheaper with your insurance), but the website can give you the price if you did not use any insurance.  - You can print the associated coupon and take it with your prescription to the pharmacy.  - You may also stop by our office during regular business hours and pick up a GoodRx coupon card.  - If you need your prescription sent electronically to a different pharmacy, notify our office through [facility] or by phone at [phone] option 4.     Si Usted Necesita Algo Despus de Su Visita  Tambin puede enviarnos un mensaje a travs de Clinical Cytogeneticist. Por lo general respondemos a los mensajes de MyChart en el transcurso de 1 a 2 das hbiles.  Para renovar recetas, por favor pida a su farmacia que se ponga en contacto con nuestra oficina. Randi lakes de fax es [place] [phone].  Si tiene un asunto urgente cuando la clnica est cerrada y que no puede esperar hasta el siguiente da hbil, puede llamar/localizar a su doctor(a) al nmero que aparece a continuacin.   Por favor, tenga en cuenta que  aunque hacemos todo lo posible para estar disponibles para asuntos urgentes fuera del horario de [place], no estamos disponibles las 24 horas del da, los 7 [phone] de la [place].   Si tiene un problema urgente y no puede comunicarse con nosotros, puede optar por buscar atencin mdica  en el consultorio de su doctor(a), en una clnica privada, en un centro de atencin urgente o en una sala de emergencias.  Si tiene engineer, drilling, por favor llame inmediatamente al 911 o vaya a la sala de emergencias.  Nmeros de bper  - Dr. Hester: [phone]  - Dra. Jackquline: [phone]  - Dr. Claudene: [phone]   En caso de inclemencias del tiempo, por favor llame a landry capes principal al [phone] para una actualizacin sobre el [place] de cualquier retraso o cierre.  Consejos para la medicacin en dermatologa: Por favor, guarde las cajas en las que vienen los medicamentos de uso tpico para ayudarle a seguir las instrucciones sobre dnde y cmo usarlos. Las farmacias generalmente imprimen las instrucciones del medicamento slo en las cajas y no directamente en los tubos del [place].   Si su medicamento es muy caro, por favor, pngase en contacto con landry rieger llamando al [phone] y presione la opcin 4 o envenos un mensaje a travs de Clinical Cytogeneticist.   No podemos decirle cul ser su copago por los medicamentos por adelantado ya que esto es diferente dependiendo de la cobertura de su seguro. Sin embargo, es posible que podamos encontrar un medicamento sustituto a audiological scientist un formulario para que el seguro cubra el medicamento que se considera necesario.   Si se requiere una autorizacin previa para que su compaa de seguros cubra su medicamento, por favor permtanos de 1 a 2 das hbiles para completar este proceso.  Los precios de los medicamentos varan con frecuencia dependiendo del environmental consultant de dnde se surte la receta y alguna farmacias pueden ofrecer precios ms  baratos.  El sitio web www.goodrx.com tiene cupones para medicamentos de health and safety inspector. Los precios aqu no tienen en cuenta lo que podra costar con la ayuda del seguro (puede ser ms barato con su seguro), pero el sitio web puede darle el precio si no utiliz tourist information centre manager.  - Puede imprimir el cupn correspondiente y llevarlo con su receta  a la farmacia.  - Tambin puede pasar por nuestra oficina durante el horario de atencin regular y education officer, museum una tarjeta de cupones de GoodRx.  - Si necesita que su receta se enve electrnicamente a una farmacia diferente, informe a nuestra oficina a travs de MyChart de [place] o por telfono llamando al [phone] y presione la opcin 4.

## 2024-09-09 NOTE — Progress Notes (Signed)
 Subjective   [name] is a 78 y.o. 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 Patient is established patient .  Today patient reports: Patient here for [facility] of biopsy proven SCC KA type at L dorsal hand and R forearm.  Review of Systems:    History of melanoma ROS - denies any fevers, chills, weight loss, night sweats, lymphadenopathy, nausea, vomiting, diarrhea, chest pain, shortness of breath, headaches, changes in vision  .  The following portions of the chart were reviewed this encounter and updated as appropriate: medications, allergies, medical history  Relevant Medical History:  Personal history of non melanoma skin cancer - see medical history for full details , Personal history of melanoma - see medical history for full details , and Personal history of actinic keratosis   Objective  (SKPE) Well appearing patient in no apparent distress; mood and affect are within normal limits. Examination was performed of the: Focused Exam of:  L dorsal hand, R forearm   Examination notable for: below  Examination limited by: Undergarments, Shoes or socks , Clothing, and Patient deferred removal     Left dorsal hand Pink bx site Right forearm Pink bx site Neck 1 cm pink plaque   Assessment & Plan  (SKAP)   Follow up of SCC KA type at  L dorsal hand, R forearm   LOC R lateral neck   Procedures, orders, diagnosis for this visit:  SCC (SQUAMOUS CELL CARCINOMA) (2) Left dorsal hand Destruction of lesion Complexity: simple   Destruction method: electrodesiccation and curettage   Informed consent: discussed and consent obtained   Timeout:  patient name, date of birth, surgical site, and procedure verified Procedure prep:  Patient was prepped and draped in usual sterile fashion Prep type:  Isopropyl alcohol Anesthesia: the lesion was anesthetized in a standard fashion   Anesthetic:  1% lidocaine  w/ epinephrine 1-100,000  local infiltration Curettage performed in three different directions: Yes   Electrodesiccation performed over the curetted area: Yes   Curettage cycles:  3 Final wound size (cm):  1 Hemostasis achieved with:  aluminum chloride and electrodesiccation Outcome: patient tolerated procedure well with no complications   Post-procedure details: sterile dressing applied and wound care instructions given   Dressing type: bandage and petrolatum    Intralesional injection Location: Left dorsal hand  Informed Consent: Discussed risks (infection, pain, bleeding, bruising, thinning of the skin, loss of skin pigment, lack of resolution, and recurrence of lesion) and benefits of the procedure, as well as the alternatives. Informed consent was obtained. Preparation: The area was prepared a standard fashion.  Procedure Details: An intralesional injection was performed with 5-fluorouracil. 0.1 cc in total were injected.  NDC: 36676-882-99 Lot: [id number] Exp:07/2025  Total number of injections: 1  Plan: The patient was instructed on post-op care. Recommend OTC analgesia as needed for pain.   Right forearm Destruction of lesion Complexity: simple   Destruction method: electrodesiccation and curettage   Informed consent: discussed and consent obtained   Timeout:  patient name, date of birth, surgical site, and procedure verified Procedure prep:  Patient was prepped and draped in usual sterile fashion Prep type:  Isopropyl alcohol Anesthesia: the lesion was anesthetized in a standard fashion   Anesthetic:  1% lidocaine  w/ epinephrine 1-100,000 local infiltration Curettage performed in three different directions: Yes   Electrodesiccation performed over the curetted area: Yes   Curettage cycles:  3 Final  wound size (cm):  1.3 Hemostasis achieved with:  aluminum chloride and electrodesiccation Outcome: patient tolerated procedure well with no complications   Post-procedure details: sterile dressing  applied and wound care instructions given   Dressing type: bandage and petrolatum    Intralesional injection Location: Right forearm   Informed Consent: Discussed risks (infection, pain, bleeding, bruising, thinning of the skin, loss of skin pigment, lack of resolution, and recurrence of lesion) and benefits of the procedure, as well as the alternatives. Informed consent was obtained. Preparation: The area was prepared a standard fashion.  Procedure Details: An intralesional injection was performed with 5-fluorouracil. 0.1 cc in total were injected.  NDC: 36676-882-99 Lot: [id number] Exp:07/2025  Total number of injections: 1  Plan: The patient was instructed on post-op care. Recommend OTC analgesia as needed for pain.   Related Medications fluorouracil (ADRUCIL) chemo injection 5 mg  NEOPLASM OF SKIN Neck Skin / nail biopsy Type of biopsy: tangential   Informed consent: discussed and consent obtained   Timeout: patient name, date of birth, surgical site, and procedure verified   Procedure prep:  Patient was prepped and draped in usual sterile fashion Prep type:  Isopropyl alcohol Anesthesia: the lesion was anesthetized in a standard fashion   Anesthetic:  1% lidocaine  w/ epinephrine 1-100,000 buffered w/ 8.4% NaHCO3 Instrument used: DermaBlade   Hemostasis achieved with: pressure and aluminum chloride   Outcome: patient tolerated procedure well   Post-procedure details: sterile dressing applied and wound care instructions given   Dressing type: bandage and petrolatum    Specimen 1 - Surgical pathology Differential Diagnosis: BCC  Check Margins: No 1 cm pink plaque   SCC (squamous cell carcinoma) -     Destruction of lesion -     Intralesional injection -     Destruction of lesion -     Intralesional injection -     fluorouracil  Neoplasm of skin -     Skin / nail biopsy -     Surgical pathology; Standing    Return to clinic: Return for As scheduled, TBSE, w/  Dr. Hester.  I, Jacquelynn V. Wilfred, CMA, am acting as scribe for Lauraine JAYSON Kanaris, MD.  Documentation: I have reviewed the above documentation for accuracy and completeness, and I agree with the above.  Lauraine JAYSON Kanaris, MD

## 2024-09-10 LAB — SURGICAL PATHOLOGY

## 2024-09-11 ENCOUNTER — Ambulatory Visit: Payer: Self-pay

## 2024-09-11 NOTE — Telephone Encounter (Signed)
-----   Message from Lauraine JAYSON Kanaris sent at 09/11/2024 10:11 AM EST -----    1. Skin, neck :       BASAL CELL CARCINOMA, NODULAR PATTERN, ULCERATED  Please notify patient with below plan: Excision  ----- Message ----- From: Interface, Lab In Three Zero One Sent: 09/10/2024   5:50 PM EST To: Lauraine JAYSON Kanaris, MD

## 2024-09-11 NOTE — Telephone Encounter (Signed)
 Patient informed of pathology results and surgery scheduled.

## 2024-10-02 ENCOUNTER — Other Ambulatory Visit: Payer: Self-pay | Admitting: Gastroenterology

## 2024-10-22 ENCOUNTER — Ambulatory Visit

## 2024-10-22 DIAGNOSIS — L821 Other seborrheic keratosis: Secondary | ICD-10-CM

## 2024-10-22 DIAGNOSIS — D489 Neoplasm of uncertain behavior, unspecified: Secondary | ICD-10-CM

## 2024-10-22 DIAGNOSIS — C44729 Squamous cell carcinoma of skin of left lower limb, including hip: Secondary | ICD-10-CM

## 2024-10-22 DIAGNOSIS — C4441 Basal cell carcinoma of skin of scalp and neck: Secondary | ICD-10-CM

## 2024-10-22 DIAGNOSIS — C44529 Squamous cell carcinoma of skin of other part of trunk: Secondary | ICD-10-CM

## 2024-10-22 MED ORDER — MUPIROCIN 2 % EX OINT: TOPICAL_OINTMENT | CUTANEOUS | 1 refills | Status: AC

## 2024-10-22 NOTE — Progress Notes (Signed)
 "   Subjective   [name] is a 78 y.o. male who presents for the following: ED&C of BCC of the post neck and LOC x2. Patient is established patient    Today patient reports: Bx proven BCC of the neck Area of concern on the left chest Area of concern on the left leg Area of concern on L lower back   Review of Systems:    No other skin or systemic complaints except as noted in HPI or Assessment and Plan.  The following portions of the chart were reviewed this encounter and updated as appropriate: medications, allergies, medical history  Relevant Medical History:  Personal history of non melanoma skin cancer - see medical history for full details   Objective  (SKPE) Well appearing patient in no apparent distress; mood and affect are within normal limits. Examination was performed of the: Focused Exam of: the neck, chest, back, left leg   Examination notable for: below   Examination limited by: Clothing and Patient deferred removal     Right neck Well healing biopsy site Left Chest 8mm pink hyperkeratotic papule  Left Thigh - Anterior 1cm hyperkeratotic plaque   L lower back with inflamed pink papule   Assessment & Plan  (SKAP)   Abscess on the mid lower back - Start mupirocin  2% ointment BID until clear     Procedures, orders, diagnosis for this visit:  BASAL CELL CARCINOMA (BCC) OF SKIN OF NECK Right neck - Destruction of lesion Complexity: simple   Destruction method: electrodesiccation and curettage   Informed consent: discussed and consent obtained   Timeout:  patient name, date of birth, surgical site, and procedure verified Procedure prep:  Patient was prepped and draped in usual sterile fashion Prep type:  Isopropyl alcohol Anesthesia: the lesion was anesthetized in a standard fashion   Anesthetic:  1% lidocaine  w/ epinephrine 1-100,000 local infiltration Curettage performed in three different directions: Yes   Electrodesiccation performed over the  curetted area: Yes   Curettage cycles:  3 Final wound size (cm):  1.3 Hemostasis achieved with:  aluminum chloride and electrodesiccation Outcome: patient tolerated procedure well with no complications   Post-procedure details: sterile dressing applied and wound care instructions given   Dressing type: bandage and petrolatum    NEOPLASM OF UNCERTAIN BEHAVIOR (2) Left Chest - Skin / nail biopsy Type of biopsy: tangential   Informed consent: discussed and consent obtained   Timeout: patient name, date of birth, surgical site, and procedure verified   Procedure prep:  Patient was prepped and draped in usual sterile fashion Prep type:  Isopropyl alcohol Anesthesia: the lesion was anesthetized in a standard fashion   Anesthetic:  1% lidocaine  w/ epinephrine 1-100,000 buffered w/ 8.4% NaHCO3 Instrument used: DermaBlade   Hemostasis achieved with: pressure and aluminum chloride   Outcome: patient tolerated procedure well   Post-procedure details: sterile dressing applied and wound care instructions given   Dressing type: bandage and petrolatum    - Destruction of lesion Complexity: simple   Destruction method: electrodesiccation and curettage   Informed consent: discussed and consent obtained   Timeout:  patient name, date of birth, surgical site, and procedure verified Procedure prep:  Patient was prepped and draped in usual sterile fashion Prep type:  Isopropyl alcohol Anesthesia: the lesion was anesthetized in a standard fashion   Anesthetic:  1% lidocaine  w/ epinephrine 1-100,000 local infiltration Curettage performed in three different directions: Yes   Electrodesiccation performed over the curetted area: Yes  Curettage cycles:  3 Final wound size (cm):  1.5 Hemostasis achieved with:  aluminum chloride and electrodesiccation Outcome: patient tolerated procedure well with no complications   Post-procedure details: sterile dressing applied and wound care instructions given    Dressing type: bandage and petrolatum    Specimen 1 - Surgical pathology Differential Diagnosis: SCC vs Other  Check Margins: No Left Thigh - Anterior - Skin / nail biopsy Type of biopsy: tangential   Informed consent: discussed and consent obtained   Timeout: patient name, date of birth, surgical site, and procedure verified   Procedure prep:  Patient was prepped and draped in usual sterile fashion Prep type:  Isopropyl alcohol Anesthesia: the lesion was anesthetized in a standard fashion   Anesthetic:  1% lidocaine  w/ epinephrine 1-100,000 buffered w/ 8.4% NaHCO3 Instrument used: DermaBlade   Hemostasis achieved with: pressure and aluminum chloride   Outcome: patient tolerated procedure well   Post-procedure details: sterile dressing applied and wound care instructions given   Dressing type: bandage and petrolatum    - Destruction of lesion Complexity: simple   Destruction method: electrodesiccation and curettage   Informed consent: discussed and consent obtained   Timeout:  patient name, date of birth, surgical site, and procedure verified Procedure prep:  Patient was prepped and draped in usual sterile fashion Prep type:  Isopropyl alcohol Anesthesia: the lesion was anesthetized in a standard fashion   Anesthetic:  1% lidocaine  w/ epinephrine 1-100,000 local infiltration Curettage performed in three different directions: Yes   Electrodesiccation performed over the curetted area: Yes   Curettage cycles:  3 Final wound size (cm):  1.2 Hemostasis achieved with:  aluminum chloride and electrodesiccation Outcome: patient tolerated procedure well with no complications   Post-procedure details: sterile dressing applied and wound care instructions given   Dressing type: bandage and petrolatum    Specimen 2 - Surgical pathology Differential Diagnosis: SCC vs Other  Check Margins: No  Basal cell carcinoma (BCC) of skin of neck -     Destruction of lesion  Neoplasm of  uncertain behavior -     Skin / nail biopsy -     Destruction of lesion -     Skin / nail biopsy -     Destruction of lesion -     Surgical pathology; Standing  Other orders -     Mupirocin ; Apply to affected area of skin three times daily until healed  Dispense: 30 g; Refill: 1  Level of service outlined above   Return to clinic: Return for Appointment as scheduled.  LILLETTE Rosina Mayans, CMA, am acting as scribe for Lauraine JAYSON Kanaris, MD .   Documentation: I have reviewed the above documentation for accuracy and completeness, and I agree with the above.  Lauraine JAYSON Kanaris, MD  "

## 2024-10-22 NOTE — Patient Instructions (Addendum)

## 2024-10-25 LAB — SURGICAL PATHOLOGY

## 2024-10-28 ENCOUNTER — Ambulatory Visit: Payer: Self-pay

## 2024-10-28 NOTE — Progress Notes (Signed)
Patient informed and voiced good understanding.

## 2024-11-03 ENCOUNTER — Other Ambulatory Visit: Payer: Self-pay | Admitting: Cardiology

## 2024-11-03 ENCOUNTER — Encounter: Payer: Self-pay | Admitting: Cardiology

## 2024-11-04 ENCOUNTER — Other Ambulatory Visit: Payer: Self-pay | Admitting: Emergency Medicine

## 2024-11-04 MED ORDER — METOPROLOL SUCCINATE ER 50 MG PO TB24: 50.0000 mg | ORAL_TABLET | Freq: Every day | ORAL | 3 refills | Status: AC

## 2024-11-04 NOTE — Progress Notes (Signed)
 Order for Metoprolol  Succinate 50 mg once daily sent to patient's pharmacy ([facility] on [address] per Dr. Anner.  Anner Alm ORN, MD to Me (Selected Message)     11/04/24  7:58 AM Yep. Me to Anner Alm ORN, MD     11/04/24  7:49 AM Good morning Dr. Anner.  Ok to refill his Metoprolol  Succinate?   Abby, RN [name] to [facility] Triage (supporting Alm ORN Anner, MD)     11/03/24  8:01 PM [facility] has been trying to get approval to refill this med for me. Could you check to see if has been done. Thanks, [name].  [phone]

## 2024-11-06 ENCOUNTER — Encounter: Payer: Self-pay | Admitting: Dermatology

## 2024-11-06 ENCOUNTER — Ambulatory Visit: Admitting: Dermatology

## 2024-11-06 DIAGNOSIS — Z1283 Encounter for screening for malignant neoplasm of skin: Secondary | ICD-10-CM | POA: Diagnosis not present

## 2024-11-06 DIAGNOSIS — L820 Inflamed seborrheic keratosis: Secondary | ICD-10-CM | POA: Diagnosis not present

## 2024-11-06 DIAGNOSIS — W908XXA Exposure to other nonionizing radiation, initial encounter: Secondary | ICD-10-CM

## 2024-11-06 DIAGNOSIS — L578 Other skin changes due to chronic exposure to nonionizing radiation: Secondary | ICD-10-CM

## 2024-11-06 DIAGNOSIS — L814 Other melanin hyperpigmentation: Secondary | ICD-10-CM | POA: Diagnosis not present

## 2024-11-06 DIAGNOSIS — M67442 Ganglion, left hand: Secondary | ICD-10-CM | POA: Diagnosis not present

## 2024-11-06 DIAGNOSIS — Z85828 Personal history of other malignant neoplasm of skin: Secondary | ICD-10-CM | POA: Diagnosis not present

## 2024-11-06 DIAGNOSIS — Z7189 Other specified counseling: Secondary | ICD-10-CM | POA: Diagnosis not present

## 2024-11-06 DIAGNOSIS — L821 Other seborrheic keratosis: Secondary | ICD-10-CM

## 2024-11-06 DIAGNOSIS — Z79899 Other long term (current) drug therapy: Secondary | ICD-10-CM

## 2024-11-06 DIAGNOSIS — L570 Actinic keratosis: Secondary | ICD-10-CM

## 2024-11-06 DIAGNOSIS — R238 Other skin changes: Secondary | ICD-10-CM | POA: Diagnosis not present

## 2024-11-06 DIAGNOSIS — D1801 Hemangioma of skin and subcutaneous tissue: Secondary | ICD-10-CM

## 2024-11-06 DIAGNOSIS — Z5111 Encounter for antineoplastic chemotherapy: Secondary | ICD-10-CM

## 2024-11-06 DIAGNOSIS — Z8589 Personal history of malignant neoplasm of other organs and systems: Secondary | ICD-10-CM

## 2024-11-06 DIAGNOSIS — M6740 Ganglion, unspecified site: Secondary | ICD-10-CM

## 2024-11-06 DIAGNOSIS — Z85820 Personal history of malignant melanoma of skin: Secondary | ICD-10-CM

## 2024-11-06 NOTE — Patient Instructions (Addendum)
 Cryotherapy Aftercare  Wash gently with soap and water everyday.   Apply Vaseline Jelly daily until healed.   Once areas treated on ears have healed resume 5FU/Calcipotriene twice daily for 7 days.   Recommend daily broad spectrum sunscreen SPF 30+ to sun-exposed areas, reapply every 2 hours as needed. Call for new or changing lesions.  Staying in the shade or wearing long sleeves, sun glasses (UVA+UVB protection) and wide brim hats (4-inch brim around the entire circumference of the hat) are also recommended for sun protection.      Melanoma ABCDEs  Melanoma is the most dangerous type of skin cancer, and is the leading cause of death from skin disease.  You are more likely to develop melanoma if you: Have light-colored skin, light-colored eyes, or red or blond hair Spend a lot of time in the sun Tan regularly, either outdoors or in a tanning bed Have had blistering sunburns, especially during childhood Have a close family member who has had a melanoma Have atypical moles or large birthmarks  Early detection of melanoma is key since treatment is typically straightforward and cure rates are extremely high if we catch it early.   The first sign of melanoma is often a change in a mole or a new dark spot.  The ABCDE system is a way of remembering the signs of melanoma.  A for asymmetry:  The two halves do not match. B for border:  The edges of the growth are irregular. C for color:  A mixture of colors are present instead of an even brown color. D for diameter:  Melanomas are usually (but not always) greater than 6mm - the size of a pencil eraser. E for evolution:  The spot keeps changing in size, shape, and color.  Please check your skin once per month between visits. You can use a small mirror in front and a large mirror behind you to keep an eye on the back side or your body.   If you see any new or changing lesions before your next follow-up, please call to schedule a  visit.  Please continue daily skin protection including broad spectrum sunscreen SPF 30+ to sun-exposed areas, reapplying every 2 hours as needed when you're outdoors.   Staying in the shade or wearing long sleeves, sun glasses (UVA+UVB protection) and wide brim hats (4-inch brim around the entire circumference of the hat) are also recommended for sun protection.      Due to recent changes in healthcare laws, you may see results of your pathology and/or laboratory studies on MyChart before the doctors have had a chance to review them. We understand that in some cases there may be results that are confusing or concerning to you. Please understand that not all results are received at the same time and often the doctors may need to interpret multiple results in order to provide you with the best plan of care or course of treatment. Therefore, we ask that you please give us  2 business days to thoroughly review all your results before contacting the office for clarification. Should we see a critical lab result, you will be contacted sooner.   If You Need Anything After Your Visit  If you have any questions or concerns for your doctor, please call our main line at [phone] and press option 4 to reach your doctor's medical assistant. If no one answers, please leave a voicemail as directed and we will return your call as soon as possible. Messages left after 4  pm will be answered the following business day.   You may also send us  a message via MyChart. We typically respond to MyChart messages within 1-2 business days.  For prescription refills, please ask your pharmacy to contact our office. Our fax number is [phone].  If you have an urgent issue when the clinic is closed that cannot wait until the next business day, you can page your doctor at the number below.    Please note that while we do our best to be available for urgent issues outside of office hours, we are not available 24/7.   If  you have an urgent issue and are unable to reach us , you may choose to seek medical care at your doctor's office, retail clinic, urgent care center, or emergency room.  If you have a medical emergency, please immediately call 911 or go to the emergency department.  Pager Numbers  - Dr. Hester: [phone]  - Dr. Jackquline: [phone]  - Dr. Claudene: [phone]   - Dr. Raymund: [phone]  In the event of inclement weather, please call our main line at [phone] for an update on the status of any delays or closures.  Dermatology Medication Tips: Please keep the boxes that topical medications come in in order to help keep track of the instructions about where and how to use these. Pharmacies typically print the medication instructions only on the boxes and not directly on the medication tubes.   If your medication is too expensive, please contact our office at [phone] option 4 or send us  a message through MyChart.   We are unable to tell what your co-pay for medications will be in advance as this is different depending on your insurance coverage. However, we may be able to find a substitute medication at lower cost or fill out paperwork to get insurance to cover a needed medication.   If a prior authorization is required to get your medication covered by your insurance company, please allow us  1-2 business days to complete this process.  Drug prices often vary depending on where the prescription is filled and some pharmacies may offer cheaper prices.  The website www.goodrx.com contains coupons for medications through different pharmacies. The prices here do not account for what the cost may be with help from insurance (it may be cheaper with your insurance), but the website can give you the price if you did not use any insurance.  - You can print the associated coupon and take it with your prescription to the pharmacy.  - You may also stop by our office during regular business  hours and pick up a GoodRx coupon card.  - If you need your prescription sent electronically to a different pharmacy, notify our office through [facility] or by phone at [phone] option 4.     Si Usted Necesita Algo Despus de Su Visita  Tambin puede enviarnos un mensaje a travs de Clinical Cytogeneticist. Por lo general respondemos a los mensajes de MyChart en el transcurso de 1 a 2 das hbiles.  Para renovar recetas, por favor pida a su farmacia que se ponga en contacto con nuestra oficina. Randi lakes de fax es [place] [phone].  Si tiene un asunto urgente cuando la clnica est cerrada y que no puede esperar hasta el siguiente da hbil, puede llamar/localizar a su doctor(a) al nmero que aparece a continuacin.   Por favor, tenga en cuenta que aunque hacemos todo lo posible para estar disponibles para asuntos urgentes fuera del horario de  oficina, no estamos disponibles las 24 horas del da, los 7 [address]  [address] de la [place].   Si tiene un problema urgente y no puede comunicarse con nosotros, puede optar por buscar atencin mdica  en el consultorio de su doctor(a), en una clnica privada, en un centro de atencin urgente o en una sala de emergencias.  Si tiene engineer, drilling, por favor llame inmediatamente al 911 o vaya a la sala de emergencias.  Nmeros de bper  - Dr. Hester: [phone]  - Dra. Jackquline: [phone]  - Dr. Claudene: [phone]  - Dra. Kitts: [phone]  En caso de inclemencias del [place], por favor llame a nuestra lnea principal al [phone] para una actualizacin sobre el estado de cualquier retraso o cierre.  Consejos para la medicacin en dermatologa: Por favor, guarde las cajas en las que vienen los medicamentos de uso tpico para ayudarle a seguir las instrucciones sobre dnde y cmo usarlos. Las farmacias generalmente imprimen las instrucciones del medicamento slo en las cajas y no directamente en los tubos del [place].   Si su  medicamento es muy caro, por favor, pngase en contacto con landry rieger llamando al [phone] y presione la opcin 4 o envenos un mensaje a travs de Clinical Cytogeneticist.   No podemos decirle cul ser su copago por los medicamentos por adelantado ya que esto es diferente dependiendo de la cobertura de su seguro. Sin embargo, es posible que podamos encontrar un medicamento sustituto a audiological scientist un formulario para que el seguro cubra el medicamento que se considera necesario.   Si se requiere una autorizacin previa para que su compaa de seguros cubra su medicamento, por favor permtanos de 1 a 2 das hbiles para completar este proceso.  Los precios de los medicamentos varan con frecuencia dependiendo del environmental consultant de dnde se surte la receta y alguna farmacias pueden ofrecer precios ms baratos.  El sitio web www.goodrx.com tiene cupones para medicamentos de health and safety inspector. Los precios aqu no tienen en cuenta lo que podra costar con la ayuda del seguro (puede ser ms barato con su seguro), pero el sitio web puede darle el precio si no utiliz tourist information centre manager.  - Puede imprimir el cupn correspondiente y llevarlo con su receta a la farmacia.  - Tambin puede pasar por nuestra oficina durante el horario de atencin regular y education officer, museum una tarjeta de cupones de GoodRx.  - Si necesita que su receta se enve electrnicamente a una farmacia diferente, informe a nuestra oficina a travs de MyChart de [place] o por telfono llamando al [phone] y presione la opcin 4.

## 2024-11-06 NOTE — Progress Notes (Signed)
 "  Follow-Up Visit   Subjective  [name] is a 79 y.o. male who presents for the following: Skin Cancer Screening and Full Body Skin Exam. HxMM, HxSCC, HxBCC.  Lesion on left palm. Painful. Itchy spot on back of left leg. Spots of concern on face.   Hx of AKs. Used 5FU/Calcipotriene on temples and cheeks ~2 months ago.   The patient presents for Total-Body Skin Exam (TBSE) for skin cancer screening and mole check. The patient has spots, moles and lesions to be evaluated, some may be new or changing and the patient may have concern these could be cancer.  The following portions of the chart were reviewed this encounter and updated as appropriate: medications, allergies, medical history  Review of Systems:  No other skin or systemic complaints except as noted in HPI or Assessment and Plan.  Objective  Well appearing patient in no apparent distress; mood and affect are within normal limits.  A full examination was performed including scalp, head, eyes, ears, nose, lips, neck, chest, axillae, abdomen, back, buttocks, bilateral upper extremities, bilateral lower extremities, hands, feet, fingers, toes, fingernails, and toenails. All findings within normal limits unless otherwise noted below.   Relevant physical exam findings are noted in the Assessment and Plan.  B/L temple/sideburn x12 (12) Erythematous keratotic or waxy stuck-on papule or plaque. Ears x4 (4) Erythematous thin papules/macules with gritty scale.  Left Mid Palm Subcutaneous cystic papule  Assessment & Plan   SKIN CANCER SCREENING PERFORMED TODAY.  HISTORY OF MELANOMA. Top of left shoulder. 2009 - No evidence of recurrence today - No lymphadenopathy - Recommend regular full body skin exams - Recommend daily broad spectrum sunscreen SPF 30+ to sun-exposed areas, reapply every 2 hours as needed.  - Call if any new or changing lesions are noted between office visits    HISTORY OF BASAL CELL CARCINOMA OF THE SKIN.  2021 left post lat base of neck , left infra auricular excision 02/2021. Right neck, EDC 10/22/2024. - No evidence of recurrence today - Recommend regular full body skin exams - Recommend daily broad spectrum sunscreen SPF 30+ to sun-exposed areas, reapply every 2 hours as needed.  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  L thigh EDC 10/22/2024.  - No evidence of recurrence today - No lymphadenopathy - Recommend regular full body skin exams - Recommend daily broad spectrum sunscreen SPF 30+ to sun-exposed areas, reapply every 2 hours as needed.  - Call if any new or changing lesions are noted between office visits  ACTINIC DAMAGE WITH PRECANCEROUS ACTINIC KERATOSES Counseling for Topical Chemotherapy Management: Patient exhibits: - Severe, confluent actinic changes with pre-cancerous actinic keratoses that is secondary to cumulative UV radiation exposure over time - Condition that is severe; chronic, not at goal. - diffuse scaly erythematous macules and papules with underlying dyspigmentation - Discussed Prescription Field Treatment topical Chemotherapy for Severe, Chronic Confluent Actinic Changes with Pre-Cancerous Actinic Keratoses Field treatment involves treatment of an entire area of skin that has confluent Actinic Changes (Sun/ Ultraviolet light damage) and PreCancerous Actinic Keratoses by method of PhotoDynamic Therapy (PDT) and/or prescription Topical Chemotherapy agents such as 5-fluorouracil , 5-fluorouracil /calcipotriene, and/or imiquimod.  The purpose is to decrease the number of clinically evident and subclinical PreCancerous lesions to prevent progression to development of skin cancer by chemically  destroying early precancer changes that may or may not be visible.  It has been shown to reduce the risk  of developing skin cancer in the treated area. As a result of treatment, redness, scaling, crusting, and open sores may occur during treatment course. One or more than one of these methods may be used and may have to be used several times to control, suppress and eliminate the PreCancerous changes. Discussed treatment course, expected reaction, and possible side effects. - Recommend daily broad spectrum sunscreen SPF 30+ to sun-exposed areas, reapply every 2 hours as needed.  - Staying in the shade or wearing long sleeves, sun glasses (UVA+UVB protection) and wide brim hats (4-inch brim around the entire circumference of the hat) are also recommended. - Call for new or changing lesions.  Once areas treated on ears have healed resume 5FU/Calcipotriene twice daily for 7 days to any evidence of persistent or rough spots.[name]   LENTIGINES, SEBORRHEIC KERATOSES, HEMANGIOMAS - Benign normal skin lesions - Benign-appearing - Call for any changes  MELANOCYTIC NEVI - Tan-brown and/or pink-flesh-colored symmetric macules and papules - Benign appearing on exam today - Observation - Call clinic for new or changing moles - Recommend daily use of broad spectrum spf 30+ sunscreen to sun-exposed areas.   INSECT BITE REACTION vs pimple Exam: edematous pink papule at L posterior thigh Treatment Plan: Benign, observe.   Apply Mupirocin  ointment twice a day as needed. Recommend DEET containing insect repellant (25% DEET or higher) such as [place] Off or Repel Sportsmen and wear protective clothing when outdoors.   INFLAMED SEBORRHEIC KERATOSIS (12) B/L temple/sideburn x12 (12) Symptomatic, irritating, patient would like treated. - Destruction of lesion - B/L temple/sideburn x12 (12) Complexity: simple   Destruction method: cryotherapy   Informed consent: discussed and consent obtained   Timeout:  patient name, date of birth, surgical site, and procedure verified Lesion destroyed using liquid nitrogen:  Yes   Region frozen until ice ball extended beyond lesion: Yes   Outcome: patient tolerated procedure well with no complications   Post-procedure details: wound care instructions given   Additional details:  Prior to procedure, discussed risks of blister formation, small wound, skin dyspigmentation, or rare scar following cryotherapy. Recommend Vaseline ointment to treated areas while healing.   AK (ACTINIC KERATOSIS) (4) Ears x4 (4) Actinic keratoses are precancerous spots that appear secondary to cumulative UV radiation exposure/sun exposure over time. They are chronic with expected duration over 1 year. A portion of actinic keratoses will progress to squamous cell carcinoma of the skin. It is not possible to reliably predict which spots will progress to skin cancer and so treatment is recommended to prevent development of skin cancer.  Recommend daily broad spectrum sunscreen SPF 30+ to sun-exposed areas, reapply every 2 hours as needed.  Recommend staying in the shade or wearing long sleeves, sun glasses (UVA+UVB protection) and wide brim hats (4-inch brim around the entire circumference of the hat). Call for new or changing lesions. - Destruction of lesion - Ears x4 (4) Complexity: simple   Destruction method: cryotherapy   Informed consent: discussed and consent obtained   Timeout:  patient name, date of birth, surgical site, and procedure verified Lesion destroyed using liquid nitrogen: Yes   Region frozen until ice ball extended beyond lesion: Yes   Outcome: patient tolerated procedure well with no complications   Post-procedure details: wound care instructions given   Additional details:  Prior to procedure, discussed risks of blister formation, small wound, skin dyspigmentation,  or rare scar following cryotherapy. Recommend Vaseline ointment to treated areas while healing.   GANGLION CYST Left Mid Palm Benign, observe.  He should review with his primary care physician. Recommend  hand surgeon for evaluation and treatment.  Return in about 6 months (around 05/06/2025) for UBSE, HxMM, HxSCC, HxBCC.  I, Jill Parcell, CMA, am acting as scribe for Alm Rhyme, MD.   Documentation: I have reviewed the above documentation for accuracy and completeness, and I agree with the above.  Alm Rhyme, MD    "

## 2025-05-07 ENCOUNTER — Ambulatory Visit: Admitting: Dermatology
